# Patient Record
Sex: Male | Born: 1940 | Race: White | Hispanic: No | Marital: Married | Smoking: Former smoker
Health system: Southern US, Community
[De-identification: ages and names within clinical notes are randomized; demographics above are authoritative.]

## PROBLEM LIST (undated history)

## (undated) DIAGNOSIS — I10 Essential (primary) hypertension: Secondary | ICD-10-CM

## (undated) DIAGNOSIS — K08109 Complete loss of teeth, unspecified cause, unspecified class: Secondary | ICD-10-CM

## (undated) DIAGNOSIS — K429 Umbilical hernia without obstruction or gangrene: Secondary | ICD-10-CM

## (undated) DIAGNOSIS — K219 Gastro-esophageal reflux disease without esophagitis: Secondary | ICD-10-CM

## (undated) DIAGNOSIS — N529 Male erectile dysfunction, unspecified: Secondary | ICD-10-CM

## (undated) DIAGNOSIS — D472 Monoclonal gammopathy: Secondary | ICD-10-CM

## (undated) DIAGNOSIS — I119 Hypertensive heart disease without heart failure: Secondary | ICD-10-CM

## (undated) DIAGNOSIS — D649 Anemia, unspecified: Secondary | ICD-10-CM

## (undated) DIAGNOSIS — Z889 Allergy status to unspecified drugs, medicaments and biological substances status: Secondary | ICD-10-CM

## (undated) DIAGNOSIS — Z972 Presence of dental prosthetic device (complete) (partial): Secondary | ICD-10-CM

## (undated) DIAGNOSIS — Z974 Presence of external hearing-aid: Secondary | ICD-10-CM

## (undated) HISTORY — PX: COLONOSCOPY: SHX174

## (undated) HISTORY — PX: HERNIA REPAIR: SHX51

## (undated) HISTORY — DX: Anemia, unspecified: D64.9

## (undated) HISTORY — PX: EYE SURGERY: SHX253

## (undated) HISTORY — DX: Monoclonal gammopathy: D47.2

---

## 2005-10-13 ENCOUNTER — Ambulatory Visit: Payer: Self-pay | Admitting: Unknown Physician Specialty

## 2005-10-20 ENCOUNTER — Ambulatory Visit: Payer: Self-pay | Admitting: Unknown Physician Specialty

## 2008-01-27 ENCOUNTER — Ambulatory Visit: Payer: Self-pay | Admitting: Unknown Physician Specialty

## 2009-05-20 ENCOUNTER — Ambulatory Visit: Payer: Self-pay | Admitting: Internal Medicine

## 2009-07-28 ENCOUNTER — Ambulatory Visit: Payer: Self-pay | Admitting: Gastroenterology

## 2009-09-12 ENCOUNTER — Ambulatory Visit: Payer: Self-pay | Admitting: Gastroenterology

## 2010-11-07 ENCOUNTER — Ambulatory Visit: Payer: Self-pay | Admitting: Surgery

## 2013-03-24 ENCOUNTER — Ambulatory Visit: Payer: Self-pay | Admitting: Unknown Physician Specialty

## 2013-03-28 LAB — PATHOLOGY REPORT

## 2013-10-24 ENCOUNTER — Ambulatory Visit: Payer: Self-pay | Admitting: Oncology

## 2013-11-22 ENCOUNTER — Ambulatory Visit: Payer: Self-pay | Admitting: Oncology

## 2014-01-31 ENCOUNTER — Ambulatory Visit: Payer: Self-pay | Admitting: Oncology

## 2014-01-31 LAB — BASIC METABOLIC PANEL
Anion Gap: 6 — ABNORMAL LOW (ref 7–16)
BUN: 22 mg/dL — AB (ref 7–18)
CALCIUM: 8.2 mg/dL — AB (ref 8.5–10.1)
Chloride: 108 mmol/L — ABNORMAL HIGH (ref 98–107)
Co2: 26 mmol/L (ref 21–32)
Creatinine: 0.97 mg/dL (ref 0.60–1.30)
EGFR (Non-African Amer.): >60
GLUCOSE: 120 mg/dL — AB (ref 65–99)
Osmolality: 284 (ref 275–301)
POTASSIUM: 3.9 mmol/L (ref 3.5–5.1)
Sodium: 140 mmol/L (ref 136–145)

## 2014-01-31 LAB — CBC CANCER CENTER
BASOS ABS: 0 x10 3/mm (ref 0.0–0.1)
BASOS PCT: 0.8 %
EOS PCT: 5.2 %
Eosinophil #: 0.3 x10 3/mm (ref 0.0–0.7)
HCT: 37.9 % — ABNORMAL LOW (ref 40.0–52.0)
HGB: 12.7 g/dL — ABNORMAL LOW (ref 13.0–18.0)
LYMPHS ABS: 2.6 x10 3/mm (ref 1.0–3.6)
LYMPHS PCT: 49.6 %
MCH: 31.7 pg (ref 26.0–34.0)
MCHC: 33.4 g/dL (ref 32.0–36.0)
MCV: 95 fL (ref 80–100)
MONO ABS: 0.4 x10 3/mm (ref 0.2–1.0)
Monocyte %: 8.1 %
NEUTROS PCT: 36.3 %
Neutrophil #: 1.9 x10 3/mm (ref 1.4–6.5)
Platelet: 183 x10 3/mm (ref 150–440)
RBC: 4 10*6/uL — AB (ref 4.40–5.90)
RDW: 14.7 % — ABNORMAL HIGH (ref 11.5–14.5)
WBC: 5.3 x10 3/mm (ref 3.8–10.6)

## 2014-01-31 LAB — IRON AND TIBC
IRON BIND. CAP.(TOTAL): 369 ug/dL (ref 250–450)
Iron Saturation: 28 %
Iron: 103 ug/dL (ref 65–175)
Unbound Iron-Bind.Cap.: 266 ug/dL

## 2014-01-31 LAB — FERRITIN: Ferritin (ARMC): 257 ng/mL (ref 8–388)

## 2014-02-05 LAB — KAPPA/LAMBDA FREE LIGHT CHAINS (ARMC)

## 2014-02-05 LAB — URINE IEP, RANDOM

## 2014-02-05 LAB — PROT IMMUNOELECTROPHORES(ARMC)

## 2014-02-22 ENCOUNTER — Ambulatory Visit: Payer: Self-pay | Admitting: Oncology

## 2014-05-03 ENCOUNTER — Ambulatory Visit: Payer: Self-pay | Admitting: Oncology

## 2014-05-03 LAB — BASIC METABOLIC PANEL
ANION GAP: 7 (ref 7–16)
BUN: 18 mg/dL (ref 7–18)
Calcium, Total: 8.3 mg/dL — ABNORMAL LOW (ref 8.5–10.1)
Chloride: 110 mmol/L — ABNORMAL HIGH (ref 98–107)
Co2: 25 mmol/L (ref 21–32)
Creatinine: 0.73 mg/dL (ref 0.60–1.30)
EGFR (African American): >60
EGFR (Non-African Amer.): >60
Glucose: 134 mg/dL — ABNORMAL HIGH (ref 65–99)
OSMOLALITY: 287 (ref 275–301)
Potassium: 3.8 mmol/L (ref 3.5–5.1)
SODIUM: 142 mmol/L (ref 136–145)

## 2014-05-03 LAB — CBC CANCER CENTER
Basophil #: 0 x10 3/mm (ref 0.0–0.1)
Basophil %: 0.7 %
Eosinophil #: 0.5 x10 3/mm (ref 0.0–0.7)
Eosinophil %: 8.7 %
HCT: 37.6 % — ABNORMAL LOW (ref 40.0–52.0)
HGB: 12.4 g/dL — ABNORMAL LOW (ref 13.0–18.0)
LYMPHS PCT: 51.5 %
Lymphocyte #: 3 x10 3/mm (ref 1.0–3.6)
MCH: 31.6 pg (ref 26.0–34.0)
MCHC: 33 g/dL (ref 32.0–36.0)
MCV: 96 fL (ref 80–100)
MONO ABS: 0.4 x10 3/mm (ref 0.2–1.0)
Monocyte %: 6.9 %
NEUTROS PCT: 32.2 %
Neutrophil #: 1.9 x10 3/mm (ref 1.4–6.5)
Platelet: 183 x10 3/mm (ref 150–440)
RBC: 3.93 10*6/uL — ABNORMAL LOW (ref 4.40–5.90)
RDW: 15.1 % — AB (ref 11.5–14.5)
WBC: 5.8 x10 3/mm (ref 3.8–10.6)

## 2014-05-04 LAB — PROT IMMUNOELECTROPHORES(ARMC)

## 2014-05-04 LAB — URINE IEP, RANDOM

## 2014-05-04 LAB — KAPPA/LAMBDA FREE LIGHT CHAINS (ARMC)

## 2014-05-25 ENCOUNTER — Ambulatory Visit: Payer: Self-pay | Admitting: Oncology

## 2014-07-17 ENCOUNTER — Ambulatory Visit: Payer: Self-pay | Admitting: Surgery

## 2014-07-24 ENCOUNTER — Ambulatory Visit: Payer: Self-pay | Admitting: Surgery

## 2014-09-23 NOTE — Op Note (Signed)
PATIENT NAME:  Austin Evans, Austin Evans MR#:  [id number] DATE OF BIRTH:  1941/04/03  DATE OF PROCEDURE:  07/24/2014  PREOPERATIVE DIAGNOSIS: Ventral hernia.   POSTOPERATIVE DIAGNOSIS: Ventral hernia.   PROCEDURE: Ventral hernia repair.   SURGEON: Loreli Dollar, MD   ANESTHESIA: General.   INDICATIONS: This 74 year old male recently has been having bulging in the epigastrium. A ventral hernia was demonstrated on physical exam and repair was recommended for definitive treatment.   DESCRIPTION OF PROCEDURE: The patient was placed on the operating table in the supine position under general anesthesia. The abdomen was prepared with ChloraPrep, draped in a sterile manner. A longitudinally oriented 4 cm incision was made in the epigastrium so that the lower end of the incision extended down as far as a supraumbilical, transversely oriented scar. The dissection was carried down through subcutaneous tissues. Several small bleeding points were cauterized. A ventral hernia sac was encountered and was dissected free from surrounding structures. It appeared that the hernia contained fatty tissue and could not be reduced. It was necessary to extend the incision in the fascia on the left side extending about 6 mm, which allowed for reduction of the hernia. The properitoneal fat was dissected away from the fascial ring defect circumferentially and then cut out a Bard soft mesh to make an oval shape some 1.6 x 2 cm. This was placed into the properitoneal plane and sutured to the overlying fascia with through-and-through 0 Surgilon sutures. Next, the repair was further carried out with a row of 0 Surgilon figure-of-eight sutures oriented with a transversely oriented suture line incorporating each suture into the mesh. The repair looked good. Hemostasis was intact. The deep fascia and subcutaneous tissues were infiltrated with 0.5% Sensorcaine with epinephrine. Subcutaneous tissues were closed with 4-0 chromic, and the skin was  closed with running 4-0 Monocryl subcuticular suture and LiquiBand. The patient tolerated surgery satisfactorily and was prepared for transfer to the recovery room.    ____________________________ Lenna Sciara. Rochel Brome, MD jws:ST D: 07/27/2014 17:39:31 ET T: 07/27/2014 23:52:28 ET JOB#: [id number]  cc: Loreli Dollar, MD, <Dictator> Loreli Dollar MD ELECTRONICALLY SIGNED 08/02/2014 13:33

## 2014-09-23 NOTE — H&P (Signed)
PATIENT NAME:  Austin Evans, Austin Evans MR#:  [id number] DATE OF BIRTH:  03-03-1941  DATE OF ADMISSION:  07/24/2014  PREOPERATIVE DIAGNOSIS: Ventral hernia.   POSTOPERATIVE DIAGNOSIS: Ventral hernia.   PROCEDURE: Ventral hernia repair.   SURGEON: Rochel Brome, MD  ANESTHESIA: General.   INDICATION: This 74 year old male has had recent development of bulging in the epigastrium and a ventral hernia was demonstrated on physical exam and repair was recommended for definitive treatment.   DESCRIPTION OF PROCEDURE: The patient was placed on the operating table in the supine position under general anesthesia. The abdomen was prepared with ChloraPrep and draped in a sterile manner.   A midline incision was made in the epigastrium approximately 4 cm in length with the lower end at the site of an old supraumbilical incision. The dissection was carried down through a thin layer of subcutaneous tissue to encounter a ventral hernia sac which there was a bulge which was approximately 2.8 cm. This appeared to contain fat. The sac was dissected free from surrounding structures and dissected away from the fascial ring defect. The fatty tissue appeared to be incarcerated and the incision was made at the left lateral portion of the fascial ring defect to enlarge the fascial ring defect enough to reduce the hernia. The sac was dissected away from the surrounding fascial ring defect and also properitoneal fat was pushed back away from the fascia circumferentially. It did appear that there was diastasis recti and somewhat thin fascia. Next, a Bard soft mesh was cut to create an oval shape of some 2.5 x 3 cm and was placed into the properitoneal plane, oriented transversely, and was sutured to the overlying fascia with 0 Surgilon through and through sutures. Next, the fascia was closed with a transversely oriented suture line of interrupted 0 Surgilon figure-of-eight sutures incorporating the mesh into each suture. The repair looked  good. It is noted that during the course of the procedure a number of small bleeding points were cauterized. Hemostasis was subsequently intact and blood loss very minimal. The fascia and subcutaneous tissues were infiltrated with 0.5% Sensorcaine with epinephrine. The subcutaneous tissues were approximated in the midline with interrupted inverted 4-0 Monocryl. The skin was closed with running 4-0 Monocryl subcuticular suture and LiquiBand.   The patient tolerated the procedure satisfactorily and was then prepared for transfer to the recovery room.  ____________________________ Lenna Sciara. Rochel Brome, MD jws:sb D: 07/24/2014 08:35:30 ET T: 07/24/2014 08:51:26 ET JOB#: [id number]  cc: Loreli Dollar, MD, <Dictator> Loreli Dollar MD ELECTRONICALLY SIGNED 07/27/2014 10:06

## 2014-10-08 ENCOUNTER — Emergency Department: Payer: Medicare Other | Admitting: Anesthesiology

## 2014-10-08 ENCOUNTER — Emergency Department: Payer: Medicare Other

## 2014-10-08 ENCOUNTER — Ambulatory Visit
Admission: EM | Admit: 2014-10-08 | Discharge: 2014-10-08 | Disposition: A | Discharge status: Home / Self Care | Payer: Medicare Other | Attending: Gastroenterology | Admitting: Gastroenterology

## 2014-10-08 ENCOUNTER — Encounter: Admission: EM | Disposition: A | Discharge status: Home / Self Care | Payer: Self-pay | Source: Home / Self Care

## 2014-10-08 ENCOUNTER — Encounter: Payer: Self-pay | Admitting: Emergency Medicine

## 2014-10-08 DIAGNOSIS — X58XXXA Exposure to other specified factors, initial encounter: Secondary | ICD-10-CM | POA: Insufficient documentation

## 2014-10-08 DIAGNOSIS — T18128A Food in esophagus causing other injury, initial encounter: Secondary | ICD-10-CM | POA: Diagnosis not present

## 2014-10-08 DIAGNOSIS — I10 Essential (primary) hypertension: Secondary | ICD-10-CM | POA: Diagnosis not present

## 2014-10-08 DIAGNOSIS — K219 Gastro-esophageal reflux disease without esophagitis: Secondary | ICD-10-CM | POA: Diagnosis not present

## 2014-10-08 DIAGNOSIS — H9190 Unspecified hearing loss, unspecified ear: Secondary | ICD-10-CM | POA: Insufficient documentation

## 2014-10-08 DIAGNOSIS — Z87891 Personal history of nicotine dependence: Secondary | ICD-10-CM | POA: Insufficient documentation

## 2014-10-08 DIAGNOSIS — Z79899 Other long term (current) drug therapy: Secondary | ICD-10-CM | POA: Insufficient documentation

## 2014-10-08 DIAGNOSIS — T18108A Unspecified foreign body in esophagus causing other injury, initial encounter: Secondary | ICD-10-CM | POA: Diagnosis present

## 2014-10-08 HISTORY — DX: Essential (primary) hypertension: I10

## 2014-10-08 HISTORY — PX: FOREIGN BODY REMOVAL: SHX962

## 2014-10-08 HISTORY — DX: Gastro-esophageal reflux disease without esophagitis: K21.9

## 2014-10-08 HISTORY — DX: Presence of external hearing-aid: Z97.4

## 2014-10-08 LAB — CBC
HCT: 39.4 % — ABNORMAL LOW (ref 40.0–52.0)
Hemoglobin: 13.3 g/dL (ref 13.0–18.0)
MCH: 30.8 pg (ref 26.0–34.0)
MCHC: 33.7 g/dL (ref 32.0–36.0)
MCV: 91.5 fL (ref 80.0–100.0)
PLATELETS: 177 10*3/uL (ref 150–440)
RBC: 4.31 MIL/uL — ABNORMAL LOW (ref 4.40–5.90)
RDW: 15.1 % — ABNORMAL HIGH (ref 11.5–14.5)
WBC: 6.3 10*3/uL (ref 3.8–10.6)

## 2014-10-08 LAB — COMPREHENSIVE METABOLIC PANEL
ALT: 34 U/L (ref 17–63)
ANION GAP: 10 (ref 5–15)
AST: 28 U/L (ref 15–41)
Albumin: 3.9 g/dL (ref 3.5–5.0)
Alkaline Phosphatase: 43 U/L (ref 38–126)
BUN: 20 mg/dL (ref 6–20)
CALCIUM: 9.1 mg/dL (ref 8.9–10.3)
CO2: 25 mmol/L (ref 22–32)
Chloride: 107 mmol/L (ref 101–111)
Creatinine, Ser: 0.78 mg/dL (ref 0.61–1.24)
GFR calc non Af Amer: >60 mL/min (ref >60)
GLUCOSE: 92 mg/dL (ref 65–99)
Potassium: 3.9 mmol/L (ref 3.5–5.1)
SODIUM: 142 mmol/L (ref 135–145)
TOTAL PROTEIN: 8.6 g/dL — AB (ref 6.5–8.1)
Total Bilirubin: 0.7 mg/dL (ref 0.3–1.2)

## 2014-10-08 SURGERY — REMOVAL, FOREIGN BODY
Anesthesia: General | Wound class: Clean Contaminated

## 2014-10-08 MED ORDER — PROPOFOL 10 MG/ML IV BOLUS
INTRAVENOUS | Status: DC | PRN
  Administered 2014-10-08: 150 mg via INTRAVENOUS

## 2014-10-08 MED ORDER — LACTATED RINGERS IV SOLN
INTRAVENOUS | Status: DC | PRN
  Administered 2014-10-08: 20:00:00 via INTRAVENOUS

## 2014-10-08 MED ORDER — GLUCAGON HCL RDNA (DIAGNOSTIC) 1 MG IJ SOLR
INTRAMUSCULAR | Status: AC
  Filled 2014-10-08: qty 1

## 2014-10-08 MED ORDER — FENTANYL CITRATE (PF) 100 MCG/2ML IJ SOLN
INTRAMUSCULAR | Status: DC | PRN
  Administered 2014-10-08: 50 ug via INTRAVENOUS

## 2014-10-08 MED ORDER — ONDANSETRON HCL 4 MG/2ML IJ SOLN: 4.0000 mg | Freq: Once | INTRAMUSCULAR | Status: AC | PRN

## 2014-10-08 MED ORDER — SUCCINYLCHOLINE CHLORIDE 20 MG/ML IJ SOLN
INTRAMUSCULAR | Status: DC | PRN
  Administered 2014-10-08: 80 mg via INTRAVENOUS

## 2014-10-08 MED ORDER — FENTANYL CITRATE (PF) 100 MCG/2ML IJ SOLN
25.0000 ug | INTRAMUSCULAR | Status: DC | PRN
Start: 2014-10-08 — End: 2014-10-09

## 2014-10-08 MED ORDER — MIDAZOLAM HCL 2 MG/2ML IJ SOLN
INTRAMUSCULAR | Status: DC | PRN
Start: 2014-10-08 — End: 2014-10-08
  Administered 2014-10-08: 1 mg via INTRAVENOUS

## 2014-10-08 MED ORDER — GLUCAGON HCL RDNA (DIAGNOSTIC) 1 MG IJ SOLR
1.0000 mg | Freq: Once | INTRAMUSCULAR | Status: AC
  Administered 2014-10-08: 1 mg via INTRAVENOUS

## 2014-10-08 MED ORDER — LIDOCAINE HCL (CARDIAC) 20 MG/ML IV SOLN
INTRAVENOUS | Status: DC | PRN
  Administered 2014-10-08: 50 mg via INTRAVENOUS

## 2014-10-08 SURGICAL SUPPLY — 1 items: FG600U ×3 IMPLANT

## 2014-10-08 NOTE — ED Notes (Signed)
Pt states he was eating steak and got a piece stuck.  Feels like now it is in midsternal area.  No resp distress , states that he cant swallow water or it will come back up

## 2014-10-08 NOTE — ED Provider Notes (Signed)
-----------------------------------------   7:18 PM on 10/08/2014 -----------------------------------------  Assuming care from nurse practitioner Victorino Dike, FNP In short, Austin Evans is a 74 y.o. male with a chief complaint of esophageal Foreign Body .  Refer to the original H&P for additional details.  The current plan of care is to have the patient go up to surgery under the care of Dr. Allen Norris patient is stable with no complaints at this time he was taken up to the surgical floor  Meshulem Onorato Verdene Rio, PA-C 10/08/14 1920  Delman Kitten, MD 10/20/14 [phone]

## 2014-10-08 NOTE — ED Notes (Signed)
No stridor present. No drooling.

## 2014-10-08 NOTE — Anesthesia Postprocedure Evaluation (Signed)
  Anesthesia Post-op Note  Patient: Austin Evans  Procedure(s) Performed: Procedure(s): upper endoscopy with FOREIGN BODY REMOVAL (N/A)  Anesthesia type:General  Patient location: PACU  Post pain: Pain level controlled  Post assessment: Post-op Vital signs reviewed, Patient's Cardiovascular Status Stable, Respiratory Function Stable, Patent Airway and No signs of Nausea or vomiting  Post vital signs: Reviewed and stable  Last Vitals:  Filed Vitals:   10/08/14 2007  BP: 159/96  Pulse: 67  Temp: 36.1 C  Resp: 24    Level of consciousness: awake, alert  and patient cooperative  Complications: No apparent anesthesia complications

## 2014-10-08 NOTE — Transfer of Care (Signed)
Immediate Anesthesia Transfer of Care Note  Patient: Austin Evans  Procedure(s) Performed: Procedure(s): upper endoscopy with FOREIGN BODY REMOVAL (N/A)  Patient Location: PACU  Anesthesia Type:General  Level of Consciousness: awake, alert , oriented and patient cooperative  Airway & Oxygen Therapy: Patient Spontanous Breathing and Patient connected to face mask oxygen  Post-op Assessment: Report given to RN, Post -op Vital signs reviewed and stable and Patient moving all extremities  Post vital signs: Reviewed and stable  Last Vitals:  Filed Vitals:   10/08/14 1923  BP: 185/93  Pulse: 70  Temp: 36.9 C  Resp: 18    Complications: No apparent anesthesia complications

## 2014-10-08 NOTE — ED Notes (Signed)
Patient transported to OR by hospital orderly. NAD noted. Accompanied by patient spouse.

## 2014-10-08 NOTE — Anesthesia Preprocedure Evaluation (Addendum)
Anesthesia Evaluation  Patient identified by MRN, date of birth, ID band Patient awake    Reviewed: Allergy & Precautions, NPO status , Patient's Chart, lab work & pertinent test results  History of Anesthesia Complications Negative for: history of anesthetic complications  Airway Mallampati: II  TM Distance: >3 FB Neck ROM: Full    Dental no notable dental hx.    Pulmonary neg pulmonary ROS, former smoker,  breath sounds clear to auscultation  Pulmonary exam normal       Cardiovascular hypertension, Pt. on medications Normal cardiovascular examRhythm:Regular Rate:Normal     Neuro/Psych negative neurological ROS  negative psych ROS   GI/Hepatic Neg liver ROS, GERD-  Medicated,  Endo/Other  negative endocrine ROS  Renal/GU negative Renal ROS  negative genitourinary   Musculoskeletal negative musculoskeletal ROS (+)   Abdominal   Peds negative pediatric ROS (+)  Hematology negative hematology ROS (+)   Anesthesia Other Findings   Reproductive/Obstetrics negative OB ROS                            Anesthesia Physical Anesthesia Plan  ASA: III and emergent  Anesthesia Plan: General   Post-op Pain Management:    Induction: Rapid sequence, Cricoid pressure planned and Intravenous  Airway Management Planned: Oral ETT  Additional Equipment:   Intra-op Plan:   Post-operative Plan: Extubation in OR  Informed Consent: I have reviewed the patients History and Physical, chart, labs and discussed the procedure including the risks, benefits and alternatives for the proposed anesthesia with the patient or authorized representative who has indicated his/her understanding and acceptance.   Dental advisory given  Plan Discussed with: CRNA and Surgeon  Anesthesia Plan Comments:         Anesthesia Quick Evaluation

## 2014-10-08 NOTE — ED Provider Notes (Signed)
[facility] Emergency Department Provider Note  ____________________________________________  Time seen: Approximately 4:57 PM  I have reviewed the triage vital signs and the nursing notes.   HISTORY  Chief Complaint Foreign Body    HPI CHESLEY VEASEY is a 74 y.o. male who presents for an esophageal foreign body. He reports that he was eating steak last night around 6:30 and feels as if it is stuck. He is unable to drink. He states that after 3 sips of any fluid he spits it back up. He reports long-standing history of the same with 2 previous endoscopies to remove the food bolus.   Past Medical History  Diagnosis Date  . Hypertension   . GERD (gastroesophageal reflux disease)   . Hearing aid worn     There are no active problems to display for this patient.   Past Surgical History  Procedure Laterality Date  . Hernia repair      No current outpatient prescriptions on file.  Allergies Review of patient's allergies indicates no known allergies.  History reviewed. No pertinent family history.  Social History History  Substance Use Topics  . Smoking status: Former Research scientist (life sciences)  . Smokeless tobacco: Not on file  . Alcohol Use: Yes    Review of Systems Constitutional: No fever/chills Eyes: No visual changes. ENT: No sore throat. Cardiovascular: Denies chest pain. Respiratory: Denies shortness of breath. Gastrointestinal: No abdominal pain.  No nausea, no vomiting.  No diarrhea.  No constipation. Genitourinary: Negative for dysuria. Musculoskeletal: Negative for pain. Skin: Negative for rash. Neurological: Negative for headaches, focal weakness or numbness.  10-point ROS otherwise negative.  ____________________________________________   PHYSICAL EXAM:  VITAL SIGNS: ED Triage Vitals  Enc Vitals Group     BP 10/08/14 1429 172/103 mmHg     Pulse --      Resp 10/08/14 1429 16     Temp 10/08/14 1429 98.3 F (36.8 C)     Temp Source  10/08/14 1429 Oral     SpO2 10/08/14 1429 99 %     Weight 10/08/14 1433 193 lb 5.5 oz (87.7 kg)     Height 10/08/14 1429 5\' 8"  (1.727 m)     Head Cir --      Peak Flow --      Pain Score 10/08/14 1427 0     Pain Loc --      Pain Edu? --      Excl. in [place]? --     Constitutional: Alert and oriented. Well appearing and in no acute distress. Eyes: Conjunctivae are normal. PERRL. EOMI. Head: Atraumatic. Nose: No congestion/rhinnorhea. Mouth/Throat: Mucous membranes are moist.  Oropharynx non-erythematous or edema. Tolerating secretions. No drooling. Neck: No stridor.  Cardiovascular: Normal rate, regular rhythm. Grossly normal heart sounds.  Good peripheral circulation. Respiratory: Normal respiratory effort.  No retractions. Lungs CTAB. Gastrointestinal: Soft and nontender. No distention. No abdominal bruits. No CVA tenderness. Musculoskeletal: Normal range of motion in all extremities Neurologic:  Normal speech and language. No gross focal neurologic deficits are appreciated. Speech is normal. No gait instability. Skin:  Skin is warm, dry and intact. No rash noted. Psychiatric: Mood and affect are normal. Speech and behavior are normal.  ____________________________________________   LABS (all labs ordered are listed, but only abnormal results are displayed)  Labs Reviewed  CBC - Abnormal; Notable for the following:    RBC 4.31 (*)    HCT 39.4 (*)    RDW 15.1 (*)    All other components  within normal limits  COMPREHENSIVE METABOLIC PANEL   ____________________________________________  EKG   ____________________________________________  RADIOLOGY  Soft tissue neck negative for foreign body. ____________________________________________   PROCEDURES  Procedure(s) performed: None  Critical Care performed: No  ____________________________________________   INITIAL IMPRESSION / ASSESSMENT AND PLAN / ED COURSE  Pertinent labs & imaging results that were available  during my care of the patient were reviewed by me and considered in my medical decision making (see chart for details).  IV glucagon ordered and given. Will reassess in 30 minutes.   ----------------------------------------- 6:07 PM on 10/08/2014 ----------------------------------------- NAD. Will attempt po challenge.  ----------------------------------------- 6:26 PM on 10/08/2014 ----------------------------------------- Unable to tolerate fluids. Dr. Allen Norris consulted and will come in to assess Mr. Plemmons.  ----------------------------------------- 7:09 PM on 10/08/2014 -----------------------------------------  Awaiting assessment and plan from Dr. Allen Norris. Patient in room in NAD at this time. Care transferred to W. Ruffian, PA-C. ____________________________________________   FINAL CLINICAL IMPRESSION(S) / ED DIAGNOSES  Final diagnoses:  None      Victorino Dike, FNP 10/08/14 [place], MD 10/08/14 2211

## 2014-10-08 NOTE — ED Notes (Signed)
Pt was eating steak last night and has had steak stuck in esophagus; PCP told him to come here.  Denies dyspnea.

## 2014-10-08 NOTE — Anesthesia Procedure Notes (Signed)
Procedure Name: Intubation Date/Time: 10/08/2014 7:43 PM Performed by: Lendon Colonel Pre-anesthesia Checklist: Patient identified, Suction available, Emergency Drugs available and Patient being monitored Patient Re-evaluated:Patient Re-evaluated prior to inductionOxygen Delivery Method: Circle system utilized Preoxygenation: Pre-oxygenation with 100% oxygen Intubation Type: IV induction, Rapid sequence and Cricoid Pressure applied Laryngoscope Size: Miller and 2 Grade View: Grade I Tube type: Oral Tube size: 7.5 mm Number of attempts: 1 Airway Equipment and Method: Stylet Placement Confirmation: ETT inserted through vocal cords under direct vision,  positive ETCO2 and breath sounds checked- equal and bilateral Secured at: 20 cm Tube secured with: Tape Dental Injury: Teeth and Oropharynx as per pre-operative assessment

## 2014-10-08 NOTE — H&P (Signed)
  [facility]  [address] [place], [place] [address] Phone: [phone] Fax : [phone]  Primary Care Physician:  No primary care provider on file. Primary Gastroenterologist:  Dr. Allen Norris  Pre-Procedure History & Physical: HPI:  Austin Evans is a 74 y.o. male is here for an endoscopy.   Past Medical History  Diagnosis Date  . Hypertension   . GERD (gastroesophageal reflux disease)   . Hearing aid worn     Past Surgical History  Procedure Laterality Date  . Hernia repair      Prior to Admission medications   Medication Sig Start Date End Date Taking? Authorizing Provider  benazepril (LOTENSIN) 20 MG tablet Take 20 mg by mouth daily.   Yes Historical Provider, MD  pantoprazole (PROTONIX) 20 MG tablet Take 20 mg by mouth daily.   Yes Historical Provider, MD    Allergies as of 10/08/2014  . (No Known Allergies)    History reviewed. No pertinent family history.  History   Social History  . Marital Status: Married    Spouse Name: N/A  . Number of Children: N/A  . Years of Education: N/A   Occupational History  . Not on file.   Social History Main Topics  . Smoking status: Former Research scientist (life sciences)  . Smokeless tobacco: Not on file  . Alcohol Use: Yes  . Drug Use: No  . Sexual Activity: Not on file   Other Topics Concern  . Not on file   Social History Narrative  . No narrative on file    Review of Systems: See HPI, otherwise negative ROS  Physical Exam: BP 185/93 mmHg  Pulse 70  Temp(Src) 98.4 F (36.9 C) (Oral)  Resp 18  Ht 5\' 8"  (1.727 m)  Wt 193 lb 5.5 oz (87.7 kg)  BMI 29.40 kg/m2  SpO2 99% General:   Alert,  pleasant and cooperative in NAD Head:  Normocephalic and atraumatic. Neck:  Supple; no masses or thyromegaly. Lungs:  Clear throughout to auscultation.    Heart:  Regular rate and rhythm. Abdomen:  Soft, nontender and nondistended. Normal bowel sounds, without guarding, and without rebound.   Neurologic:  Alert and   oriented x4;  grossly normal neurologically.  Impression/Plan: Austin Evans is here for an endoscopy to be performed for Food bolu  Risks, benefits, limitations, and alternatives regarding  endoscopy have been reviewed with the patient.  Questions have been answered.  All parties agreeable.   [facility], MD  10/08/2014, 7:36 PM

## 2014-10-09 ENCOUNTER — Encounter: Payer: Self-pay | Admitting: Gastroenterology

## 2014-10-09 NOTE — Op Note (Signed)
[facility] Gastroenterology Patient Name: Austin Evans Procedure Date: 10/08/2014 7:13 PM MRN: [id number] Account #: [id number] Date of Birth: 1941-05-17 Admit Type: Outpatient Age: 74 Room: [facility] ENDO ROOM 4 Gender: Male Note Status: Finalized Procedure:         Upper GI endoscopy Indications:       Foreign body in the esophagus Providers:         Lucilla Lame, MD Medicines:         General Anesthesia Complications:     No immediate complications. Procedure:         Pre-Anesthesia Assessment:                    - Prior to the procedure, a History and Physical was                     performed, and patient medications and allergies were                     reviewed. The patient's tolerance of previous anesthesia                     was also reviewed. The risks and benefits of the procedure                     and the sedation options and risks were discussed with the                     patient. All questions were answered, and informed consent                     was obtained. Prior Anticoagulants: The patient has taken                     no previous anticoagulant or antiplatelet agents. ASA                     Grade Assessment: II - A patient with mild systemic                     disease. After reviewing the risks and benefits, the                     patient was deemed in satisfactory condition to undergo                     the procedure.                    After obtaining informed consent, the endoscope was passed                     under direct vision. Throughout the procedure, the                     patient's blood pressure, pulse, and oxygen saturations                     were monitored continuously. The Olympus GIF-160 endoscope                     (S#. [id number]) was introduced through the mouth, and                     advanced to the second part of duodenum.  The upper GI                     endoscopy was accomplished without difficulty. The patient                   tolerated the procedure well. Findings:      Food was found at the gastroesophageal junction. Removal was       accomplished with a Tripod.      The stomach was normal.      The examined duodenum was normal. Impression:        - Food was found in the esophagus. Removal was successful.                    - Normal stomach.                    - Normal examined duodenum. Recommendation:    - Repeat the upper endoscopy in 3 weeks for retreatment. Procedure Code(s): --- Professional ---                    [id number], Esophagogastroduodenoscopy, flexible, transoral;                     with removal of foreign body(s) Diagnosis Code(s): --- Professional ---                    L85.909P, Food in esophagus causing other injury, initial                     encounter                    T18.108A, Unspecified foreign body in esophagus causing                     other injury, initial encounter CPT copyright 2014 American Medical Association. All rights reserved. The codes documented in this report are preliminary and upon coder review may  be revised to meet current compliance requirements. Lucilla Lame, MD 10/08/2014 7:52:24 PM This report has been signed electronically. Number of Addenda: 0 Note Initiated On: 10/08/2014 7:13 PM      [facility]

## 2014-11-21 ENCOUNTER — Encounter: Payer: Self-pay | Admitting: *Deleted

## 2014-11-28 NOTE — Discharge Instructions (Signed)

## 2014-11-30 ENCOUNTER — Ambulatory Visit: Payer: Medicare Other | Admitting: Anesthesiology

## 2014-11-30 ENCOUNTER — Encounter: Payer: Self-pay | Admitting: *Deleted

## 2014-11-30 ENCOUNTER — Encounter
Admission: RE | Disposition: A | Discharge status: Home / Self Care | Payer: Self-pay | Source: Ambulatory Visit | Attending: Gastroenterology

## 2014-11-30 ENCOUNTER — Other Ambulatory Visit: Payer: Self-pay | Admitting: Gastroenterology

## 2014-11-30 ENCOUNTER — Ambulatory Visit
Admission: RE | Admit: 2014-11-30 | Discharge: 2014-11-30 | Disposition: A | Discharge status: Home / Self Care | Payer: Medicare Other | Source: Ambulatory Visit | Attending: Gastroenterology | Admitting: Gastroenterology

## 2014-11-30 DIAGNOSIS — Z79899 Other long term (current) drug therapy: Secondary | ICD-10-CM | POA: Insufficient documentation

## 2014-11-30 DIAGNOSIS — K319 Disease of stomach and duodenum, unspecified: Secondary | ICD-10-CM | POA: Diagnosis not present

## 2014-11-30 DIAGNOSIS — K2950 Unspecified chronic gastritis without bleeding: Secondary | ICD-10-CM | POA: Insufficient documentation

## 2014-11-30 DIAGNOSIS — Z7951 Long term (current) use of inhaled steroids: Secondary | ICD-10-CM | POA: Diagnosis not present

## 2014-11-30 DIAGNOSIS — I10 Essential (primary) hypertension: Secondary | ICD-10-CM | POA: Insufficient documentation

## 2014-11-30 DIAGNOSIS — K219 Gastro-esophageal reflux disease without esophagitis: Secondary | ICD-10-CM | POA: Insufficient documentation

## 2014-11-30 DIAGNOSIS — Z87891 Personal history of nicotine dependence: Secondary | ICD-10-CM | POA: Diagnosis not present

## 2014-11-30 DIAGNOSIS — K222 Esophageal obstruction: Secondary | ICD-10-CM | POA: Insufficient documentation

## 2014-11-30 DIAGNOSIS — R1310 Dysphagia, unspecified: Secondary | ICD-10-CM | POA: Insufficient documentation

## 2014-11-30 DIAGNOSIS — K2970 Gastritis, unspecified, without bleeding: Secondary | ICD-10-CM | POA: Diagnosis not present

## 2014-11-30 HISTORY — DX: Complete loss of teeth, unspecified cause, unspecified class: K08.109

## 2014-11-30 HISTORY — PX: ESOPHAGOGASTRODUODENOSCOPY: SHX5428

## 2014-11-30 HISTORY — DX: Presence of dental prosthetic device (complete) (partial): Z97.2

## 2014-11-30 SURGERY — EGD (ESOPHAGOGASTRODUODENOSCOPY)
Anesthesia: Monitor Anesthesia Care | Wound class: Clean Contaminated

## 2014-11-30 MED ORDER — GLYCOPYRROLATE 0.2 MG/ML IJ SOLN
INTRAMUSCULAR | Status: DC | PRN
  Administered 2014-11-30: 0.2 mg via INTRAVENOUS

## 2014-11-30 MED ORDER — LIDOCAINE HCL (CARDIAC) 20 MG/ML IV SOLN
INTRAVENOUS | Status: DC | PRN
  Administered 2014-11-30: 50 mg via INTRAVENOUS

## 2014-11-30 MED ORDER — PROPOFOL 10 MG/ML IV BOLUS
INTRAVENOUS | Status: DC | PRN
  Administered 2014-11-30: 20 mg via INTRAVENOUS
  Administered 2014-11-30: 50 mg via INTRAVENOUS
  Administered 2014-11-30: 40 mg via INTRAVENOUS
  Administered 2014-11-30: 20 mg via INTRAVENOUS

## 2014-11-30 MED ORDER — ACETAMINOPHEN 325 MG PO TABS
325.0000 mg | ORAL_TABLET | ORAL | Status: DC | PRN
Start: 2014-11-30 — End: 2014-11-30

## 2014-11-30 MED ORDER — LACTATED RINGERS IV SOLN: INTRAVENOUS | Status: DC

## 2014-11-30 MED ORDER — ACETAMINOPHEN 160 MG/5ML PO SOLN: 325.0000 mg | ORAL | Status: DC | PRN

## 2014-11-30 MED ORDER — LACTATED RINGERS IV SOLN
INTRAVENOUS | Status: DC
  Administered 2014-11-30: 09:00:00 via INTRAVENOUS

## 2014-11-30 SURGICAL SUPPLY — 39 items
BALLN DILATOR 10-12 8 (BALLOONS)
BALLN DILATOR 12-15 8 (BALLOONS) ×2
BALLN DILATOR 15-18 8 (BALLOONS)
BALLN DILATOR CRE 0-12 8 (BALLOONS)
BALLN DILATOR ESOPH 8 10 CRE (MISCELLANEOUS) IMPLANT
BALLOON DILATOR 12-15 8 (BALLOONS) ×1 IMPLANT
BALLOON DILATOR 15-18 8 (BALLOONS) IMPLANT
BALLOON DILATOR CRE 0-12 8 (BALLOONS) IMPLANT
BLOCK BITE 60FR ADLT L/F GRN (MISCELLANEOUS) ×2 IMPLANT
CANISTER SUCT 1200ML W/VALVE (MISCELLANEOUS) ×2 IMPLANT
FCP ESCP3.2XJMB 240X2.8X (MISCELLANEOUS)
FORCEPS BIOP RAD 4 LRG CAP 4 (CUTTING FORCEPS) IMPLANT
FORCEPS BIOP RJ4 240 W/NDL (MISCELLANEOUS)
FORCEPS ESCP3.2XJMB 240X2.8X (MISCELLANEOUS) IMPLANT
GOWN CVR UNV OPN BCK APRN NK (MISCELLANEOUS) ×2 IMPLANT
GOWN ISOL THUMB LOOP REG UNIV (MISCELLANEOUS) ×2
HEMOCLIP INSTINCT (CLIP) IMPLANT
INJECTOR VARIJECT VIN23 (MISCELLANEOUS) IMPLANT
KIT CO2 TUBING (TUBING) IMPLANT
KIT DEFENDO VALVE AND CONN (KITS) IMPLANT
KIT ENDO PROCEDURE OLY (KITS) ×2 IMPLANT
LIGATOR MULTIBAND 6SHOOTER MBL (MISCELLANEOUS) IMPLANT
MARKER SPOT ENDO TATTOO 5ML (MISCELLANEOUS) IMPLANT
PAD GROUND ADULT SPLIT (MISCELLANEOUS) IMPLANT
SNARE SHORT THROW 13M SML OVAL (MISCELLANEOUS) IMPLANT
SNARE SHORT THROW 30M LRG OVAL (MISCELLANEOUS) IMPLANT
SPOT EX ENDOSCOPIC TATTOO (MISCELLANEOUS)
SUCTION POLY TRAP 4CHAMBER (MISCELLANEOUS) IMPLANT
SYR INFLATION 60ML (SYRINGE) ×2 IMPLANT
TRAP SUCTION POLY (MISCELLANEOUS) IMPLANT
TUBING CONN 6MMX3.1M (TUBING)
TUBING SUCTION CONN 0.25 STRL (TUBING) IMPLANT
UNDERPAD 30X60 958B10 (PK) (MISCELLANEOUS) IMPLANT
VALVE BIOPSY ENDO (VALVE) IMPLANT
VARIJECT INJECTOR VIN23 (MISCELLANEOUS)
WATER AUXILLARY (MISCELLANEOUS) IMPLANT
WATER STERILE IRR 250ML POUR (IV SOLUTION) ×2 IMPLANT
WATER STERILE IRR 500ML POUR (IV SOLUTION) IMPLANT
WIRE CRE 18-20MM 8CM F G (MISCELLANEOUS) IMPLANT

## 2014-11-30 NOTE — H&P (Signed)
  [facility]  [address], [place], [place] [address] Phone: [phone] Fax : [phone]  Primary Care Physician:  Adrian Prows, MD Primary Gastroenterologist:  Dr. Allen Norris  Pre-Procedure History & Physical: HPI:  Austin Evans is a 74 y.o. male is here for an endoscopy.   Past Medical History  Diagnosis Date  . Hypertension   . GERD (gastroesophageal reflux disease)   . Hearing aid worn   . Full dentures     upper and lower    Past Surgical History  Procedure Laterality Date  . Hernia repair    . Foreign body removal N/A 10/08/2014    Procedure: upper endoscopy with FOREIGN BODY REMOVAL;  Surgeon: Lucilla Lame, MD;  Location: [facility] ENDOSCOPY;  Service: Endoscopy;  Laterality: N/A;  . Eye surgery Bilateral     cataracts    Prior to Admission medications   Medication Sig Start Date End Date Taking? Authorizing Provider  benazepril (LOTENSIN) 20 MG tablet Take 20 mg by mouth daily. AM   Yes Historical Provider, MD  fexofenadine-pseudoephedrine (ALLEGRA-D 24) 180-240 MG per 24 hr tablet Take 1 tablet by mouth daily. AM   Yes Historical Provider, MD  fluticasone (FLONASE) 50 MCG/ACT nasal spray Place 1 spray into both nostrils daily. AM   Yes Historical Provider, MD  pantoprazole (PROTONIX) 20 MG tablet Take 20 mg by mouth daily. AM   Yes Historical Provider, MD    Allergies as of 11/05/2014  . (No Known Allergies)    History reviewed. No pertinent family history.  History   Social History  . Marital Status: Married    Spouse Name: N/A  . Number of Children: N/A  . Years of Education: N/A   Occupational History  . Not on file.   Social History Main Topics  . Smoking status: Former Smoker    Quit date: 05/26/2003  . Smokeless tobacco: Not on file  . Alcohol Use: 6.0 oz/week    10 Shots of liquor per week  . Drug Use: No  . Sexual Activity: Not on file   Other Topics Concern  . Not on file   Social History Narrative    Review of  Systems: See HPI, otherwise negative ROS  Physical Exam: BP 163/91 mmHg  Pulse 64  Temp(Src) 97.7 F (36.5 C)  Resp 18  Ht 5\' 8"  (1.727 m)  Wt 193 lb (87.544 kg)  BMI 29.35 kg/m2  SpO2 98% General:   Alert,  pleasant and cooperative in NAD Head:  Normocephalic and atraumatic. Neck:  Supple; no masses or thyromegaly. Lungs:  Clear throughout to auscultation.    Heart:  Regular rate and rhythm. Abdomen:  Soft, nontender and nondistended. Normal bowel sounds, without guarding, and without rebound.   Neurologic:  Alert and  oriented x4;  grossly normal neurologically.  Impression/Plan: RITCHIE KLEE is here for an endoscopy to be performed for Dysphagia   Risks, benefits, limitations, and alternatives regarding  endoscopy have been reviewed with the patient.  Questions have been answered.  All parties agreeable.   Ollen Bowl, MD  11/30/2014, 8:57 AM

## 2014-11-30 NOTE — Op Note (Signed)
[facility]astro[facility] Patient Name: Austin Evans Procedure Date: 11/30/2014 8:54 AM MRN: [id number] Account #: [id number] Date of Birth: 10/10/1940 Admit Type: Outpatient Age: 74 Room: [facility] OR ROOM 01 Gender: Male Note Status: Finalized Procedure:         Upper GI endoscopy Indications:       Dysphagia Providers:         Lucilla Lame, MD Referring MD:      Youlanda Roys. Ola Spurr, MD (Referring MD) Medicines:         Propofol per Anesthesia Complications:     No immediate complications. Procedure:         Pre-Anesthesia Assessment:                    - Prior to the procedure, a History and Physical was                     performed, and patient medications and allergies were                     reviewed. The patient's tolerance of previous anesthesia                     was also reviewed. The risks and benefits of the procedure                     and the sedation options and risks were discussed with the                     patient. All questions were answered, and informed consent                     was obtained. Prior Anticoagulants: The patient has taken                     no previous anticoagulant or antiplatelet agents. ASA                     Grade Assessment: II - A patient with mild systemic                     disease. After reviewing the risks and benefits, the                     patient was deemed in satisfactory condition to undergo                     the procedure.                    After obtaining informed consent, the endoscope was passed                     under direct vision. Throughout the procedure, the                     patient's blood pressure, pulse, and oxygen saturations                     were monitored continuously. The Olympus GIF-HQ190                     Endoscope (S#. [id number]) was introduced through the mouth,  and advanced to the second part of duodenum. The upper GI                     endoscopy was  accomplished without difficulty. The patient                     tolerated the procedure well. Findings:      A benign-appearing, intrinsic mild stenosis was found at the       gastroesophageal junction and was traversed. A TTS dilator was passed       through the scope. Dilation with a 15-16.5-18 mm balloon (to a maximum       balloon size of 18 mm) dilator was performed. The dilation site was       examined and showed complete resolution of luminal narrowing.      Localized moderate inflammation characterized by erythema was found in       the gastric antrum. Biopsies were taken with a cold forceps for       histology.      Localized nodular mucosa was found in the ampulla. Biopsies were taken       with a cold forceps for histology. Impression:        - Benign-appearing esophageal stricture. Dilated.                    - Gastritis. Biopsied.                    - Nodular mucosa in the ampulla. Biopsied. Recommendation:    - Await pathology results. Procedure Code(s): --- Professional ---                    [phone], Esophagogastroduodenoscopy, flexible, transoral;                     with transendoscopic balloon dilation of esophagus (less                     than 30 mm diameter)                    43239, Esophagogastroduodenoscopy, flexible, transoral;                     with biopsy, single or multiple Diagnosis Code(s): --- Professional ---                    R13.10, Dysphagia, unspecified                    K22.2, Esophageal obstruction                    K29.70, Gastritis, unspecified, without bleeding                    K31.9, Disease of stomach and duodenum, unspecified CPT copyright 2014 American Medical Association. All rights reserved. The codes documented in this report are preliminary and upon coder review may  be revised to meet current compliance requirements. Lucilla Lame, MD 11/30/2014 9:17:11 AM This report has been signed electronically. Number of Addenda: 0 Note  Initiated On: 11/30/2014 8:54 AM Total Procedure Duration: 0 hours 7 minutes 8 seconds       [facility]

## 2014-11-30 NOTE — Anesthesia Procedure Notes (Signed)
Procedure Name: MAC Performed by: Leen Tworek Pre-anesthesia Checklist: Patient identified, Emergency Drugs available, Suction available, Timeout performed and Patient being monitored Patient Re-evaluated:Patient Re-evaluated prior to inductionOxygen Delivery Method: Nasal cannula Placement Confirmation: positive ETCO2       

## 2014-11-30 NOTE — Anesthesia Postprocedure Evaluation (Signed)
  Anesthesia Post-op Note  Patient: Austin Evans  Procedure(s) Performed: Procedure(s): ESOPHAGOGASTRODUODENOSCOPY (EGD) with dialtion (N/A)  Anesthesia type:MAC  Patient location: PACU  Post pain: Pain level controlled  Post assessment: Post-op Vital signs reviewed, Patient's Cardiovascular Status Stable, Respiratory Function Stable, Patent Airway and No signs of Nausea or vomiting  Post vital signs: Reviewed and stable  Last Vitals:  Filed Vitals:   11/30/14 0920  BP: 124/84  Pulse: 73  Temp: 36.3 C  Resp: 11    Level of consciousness: awake, alert  and patient cooperative  Complications: No apparent anesthesia complications

## 2014-11-30 NOTE — Transfer of Care (Signed)
Immediate Anesthesia Transfer of Care Note  Patient: Austin Evans  Procedure(s) Performed: Procedure(s): ESOPHAGOGASTRODUODENOSCOPY (EGD) with dialtion (N/A)  Patient Location: PACU  Anesthesia Type: MAC  Level of Consciousness: awake, alert  and patient cooperative  Airway and Oxygen Therapy: Patient Spontanous Breathing and Patient connected to supplemental oxygen  Post-op Assessment: Post-op Vital signs reviewed, Patient's Cardiovascular Status Stable, Respiratory Function Stable, Patent Airway and No signs of Nausea or vomiting  Post-op Vital Signs: Reviewed and stable  Complications: No apparent anesthesia complications

## 2014-11-30 NOTE — Anesthesia Preprocedure Evaluation (Addendum)
Anesthesia Evaluation   Patient awake    Reviewed: Allergy & Precautions, NPO status   Airway Mallampati: I  TM Distance: >3 FB Neck ROM: full    Dental  (+) Upper Dentures, Lower Dentures   Pulmonary former smoker,    Pulmonary exam normal       Cardiovascular hypertension, Normal cardiovascular exam    Neuro/Psych negative neurological ROS  negative psych ROS   GI/Hepatic Neg liver ROS, GERD-  ,  Endo/Other  negative endocrine ROS  Renal/GU negative Renal ROS  negative genitourinary   Musculoskeletal   Abdominal   Peds  Hematology negative hematology ROS (+)   Anesthesia Other Findings   Reproductive/Obstetrics                           Anesthesia Physical Anesthesia Plan  ASA: II  Anesthesia Plan: MAC   Post-op Pain Management:    Induction: Intravenous  Airway Management Planned: Nasal Cannula  Additional Equipment:   Intra-op Plan:   Post-operative Plan:   Informed Consent: I have reviewed the patients History and Physical, chart, labs and discussed the procedure including the risks, benefits and alternatives for the proposed anesthesia with the patient or authorized representative who has indicated his/her understanding and acceptance.     Plan Discussed with:   Anesthesia Plan Comments:         Anesthesia Quick Evaluation

## 2014-12-18 ENCOUNTER — Encounter: Payer: Self-pay | Admitting: Gastroenterology

## 2015-05-03 ENCOUNTER — Other Ambulatory Visit: Payer: Self-pay | Admitting: Oncology

## 2015-05-03 DIAGNOSIS — D472 Monoclonal gammopathy: Secondary | ICD-10-CM

## 2015-05-06 ENCOUNTER — Inpatient Hospital Stay: Payer: Medicare Other | Attending: Oncology

## 2015-05-06 DIAGNOSIS — K219 Gastro-esophageal reflux disease without esophagitis: Secondary | ICD-10-CM | POA: Diagnosis not present

## 2015-05-06 DIAGNOSIS — I10 Essential (primary) hypertension: Secondary | ICD-10-CM | POA: Insufficient documentation

## 2015-05-06 DIAGNOSIS — D472 Monoclonal gammopathy: Secondary | ICD-10-CM | POA: Insufficient documentation

## 2015-05-06 DIAGNOSIS — Z87891 Personal history of nicotine dependence: Secondary | ICD-10-CM | POA: Diagnosis not present

## 2015-05-06 DIAGNOSIS — Z79899 Other long term (current) drug therapy: Secondary | ICD-10-CM | POA: Diagnosis not present

## 2015-05-06 LAB — BASIC METABOLIC PANEL
Anion gap: 5 (ref 5–15)
BUN: 19 mg/dL (ref 6–20)
CHLORIDE: 106 mmol/L (ref 101–111)
CO2: 27 mmol/L (ref 22–32)
Calcium: 8.3 mg/dL — ABNORMAL LOW (ref 8.9–10.3)
Creatinine, Ser: 0.77 mg/dL (ref 0.61–1.24)
GFR calc non Af Amer: >60 mL/min (ref >60)
Glucose, Bld: 103 mg/dL — ABNORMAL HIGH (ref 65–99)
POTASSIUM: 3.6 mmol/L (ref 3.5–5.1)
Sodium: 138 mmol/L (ref 135–145)

## 2015-05-06 LAB — CBC WITH DIFFERENTIAL/PLATELET
Basophils Absolute: 0 10*3/uL (ref 0–0.1)
Basophils Relative: 1 %
EOS ABS: 0.2 10*3/uL (ref 0–0.7)
Eosinophils Relative: 3 %
HCT: 36.3 % — ABNORMAL LOW (ref 40.0–52.0)
Hemoglobin: 12 g/dL — ABNORMAL LOW (ref 13.0–18.0)
LYMPHS ABS: 2.6 10*3/uL (ref 1.0–3.6)
LYMPHS PCT: 48 %
MCH: 30.2 pg (ref 26.0–34.0)
MCHC: 33.2 g/dL (ref 32.0–36.0)
MCV: 91 fL (ref 80.0–100.0)
Monocytes Absolute: 0.4 10*3/uL (ref 0.2–1.0)
Monocytes Relative: 7 %
Neutro Abs: 2.2 10*3/uL (ref 1.4–6.5)
Neutrophils Relative %: 41 %
Platelets: 188 10*3/uL (ref 150–440)
RBC: 3.99 MIL/uL — AB (ref 4.40–5.90)
RDW: 15.9 % — ABNORMAL HIGH (ref 11.5–14.5)
WBC: 5.4 10*3/uL (ref 3.8–10.6)

## 2015-05-07 LAB — PROTEIN ELECTROPHORESIS, SERUM
A/G Ratio: 0.9 (ref 0.7–1.7)
ALBUMIN ELP: 3.6 g/dL (ref 2.9–4.4)
Alpha-1-Globulin: 0.2 g/dL (ref 0.0–0.4)
Alpha-2-Globulin: 0.7 g/dL (ref 0.4–1.0)
BETA GLOBULIN: 2.9 g/dL — AB (ref 0.7–1.3)
GAMMA GLOBULIN: 0.2 g/dL — AB (ref 0.4–1.8)
Globulin, Total: 4.1 g/dL — ABNORMAL HIGH (ref 2.2–3.9)
M-Spike, %: 2 g/dL — ABNORMAL HIGH
Total Protein ELP: 7.7 g/dL (ref 6.0–8.5)

## 2015-05-08 LAB — PROTEIN ELECTRO, RANDOM URINE
ALBUMIN ELP UR: 22.4 %
ALPHA-2-GLOBULIN, U: 8.1 %
Alpha-1-Globulin, U: 1.5 %
Beta Globulin, U: 63.8 %
Gamma Globulin, U: 4.3 %
M Component, Ur: 56.2 % — ABNORMAL HIGH
TOTAL PROTEIN, URINE-UPE24: 73.1 mg/dL

## 2015-05-13 ENCOUNTER — Inpatient Hospital Stay (HOSPITAL_BASED_OUTPATIENT_CLINIC_OR_DEPARTMENT_OTHER): Payer: Medicare Other | Admitting: Oncology

## 2015-05-13 ENCOUNTER — Encounter: Payer: Self-pay | Admitting: Oncology

## 2015-05-13 DIAGNOSIS — Z79899 Other long term (current) drug therapy: Secondary | ICD-10-CM | POA: Diagnosis not present

## 2015-05-13 DIAGNOSIS — D472 Monoclonal gammopathy: Secondary | ICD-10-CM

## 2015-05-13 NOTE — Progress Notes (Signed)
Patient is currently being treated for pneumonia.

## 2015-05-25 NOTE — Progress Notes (Signed)
Austin Evans  Telephone:(336) [phone] Fax:(336) [phone]  ID: Austin Evans OB: 11/11/1940  MR#: [id number]  CHY#:[id number]  Patient Care Team: Adrian Prows, MD as PCP - General (Infectious Diseases)  CHIEF COMPLAINT:  Chief Complaint  Patient presents with  . MGUS    INTERVAL HISTORY: Patient returns to clinic today for routine yearly evaluation and repeat laboratory work. He continues to feel well and is asymptomatic. He does not complain of weakness and fatigue.  He denies any neurologic complaints. He has a good appetite and denies weight loss. He denies any bony pain. He has no chest pain or shortness of breath. He denies any nausea, vomiting, constipation, or diarrhea. He has no melena or hematochezia. He has no urinary complaints. Patient offers no specific complaints today.  REVIEW OF SYSTEMS:   Review of Systems  Constitutional: Negative.  Negative for fever, weight loss and malaise/fatigue.  Respiratory: Negative.  Negative for shortness of breath.   Cardiovascular: Negative.  Negative for chest pain.  Gastrointestinal: Negative.   Musculoskeletal: Negative.   Neurological: Negative.  Negative for weakness.    As per HPI. Otherwise, a complete review of systems is negatve.  PAST MEDICAL HISTORY: Past Medical History  Diagnosis Date  . Hypertension   . GERD (gastroesophageal reflux disease)   . Hearing aid worn   . Full dentures     upper and lower  . MGUS (monoclonal gammopathy of unknown significance)   . Anemia     PAST SURGICAL HISTORY: Past Surgical History  Procedure Laterality Date  . Hernia repair    . Foreign body removal N/A 10/08/2014    Procedure: upper endoscopy with FOREIGN BODY REMOVAL;  Surgeon: Lucilla Lame, MD;  Location: [facility] ENDOSCOPY;  Service: Endoscopy;  Laterality: N/A;  . Eye surgery Bilateral     cataracts  . Esophagogastroduodenoscopy N/A 11/30/2014    Procedure: ESOPHAGOGASTRODUODENOSCOPY (EGD) with dialtion;   Surgeon: Lucilla Lame, MD;  Location: [place];  Service: Gastroenterology;  Laterality: N/A;    FAMILY HISTORY: Mother with history of dementia.     ADVANCED DIRECTIVES:    HEALTH MAINTENANCE: Social History  Substance Use Topics  . Smoking status: Former Smoker    Quit date: 05/26/2003  . Smokeless tobacco: Not on file  . Alcohol Use: 6.0 oz/week    10 Shots of liquor per week     Colonoscopy:  PAP:  Bone density:  Lipid panel:  No Known Allergies  Current Outpatient Prescriptions  Medication Sig Dispense Refill  . benazepril (LOTENSIN) 20 MG tablet Take 20 mg by mouth daily. AM    . fexofenadine-pseudoephedrine (ALLEGRA-D 24) 180-240 MG per 24 hr tablet Take 1 tablet by mouth daily. AM    . fluticasone (FLONASE) 50 MCG/ACT nasal spray Place 1 spray into both nostrils daily. AM    . minocycline (MINOCIN,DYNACIN) 100 MG capsule take 1 capsule by mouth twice a day for 10 days  0  . pantoprazole (PROTONIX) 20 MG tablet Take 20 mg by mouth daily. AM     No current facility-administered medications for this visit.    OBJECTIVE: There were no vitals filed for this visit.   There is no weight on file to calculate BMI.    ECOG FS:0 - Asymptomatic  General: Well-developed, well-nourished, no acute distress. Eyes: Pink conjunctiva, anicteric sclera. Lungs: Clear to auscultation bilaterally. Heart: Regular rate and rhythm. No rubs, murmurs, or gallops. Abdomen: Soft, nontender, nondistended. No organomegaly noted, normoactive bowel sounds. Musculoskeletal: No edema,  cyanosis, or clubbing. Neuro: Alert, answering all questions appropriately. Cranial nerves grossly intact. Skin: No rashes or petechiae noted. Psych: Normal affect.   LAB RESULTS:  Lab Results  Component Value Date   NA 138 05/06/2015   K 3.6 05/06/2015   CL 106 05/06/2015   CO2 27 05/06/2015   GLUCOSE 103* 05/06/2015   BUN 19 05/06/2015   CREATININE 0.77 05/06/2015   CALCIUM 8.3* 05/06/2015     PROT 8.6* 10/08/2014   ALBUMIN 3.9 10/08/2014   AST 28 10/08/2014   ALT 34 10/08/2014   ALKPHOS 43 10/08/2014   BILITOT 0.7 10/08/2014   GFRNONAA >60 05/06/2015   GFRAA >60 05/06/2015    Lab Results  Component Value Date   WBC 5.4 05/06/2015   NEUTROABS 2.2 05/06/2015   HGB 12.0* 05/06/2015   HCT 36.3* 05/06/2015   MCV 91.0 05/06/2015   PLT 188 05/06/2015     STUDIES: No results found.  ASSESSMENT: MGUS.  PLAN:    1. 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 His serum M spike is stable at 2.0. He has no evidence of endorgan damage. Metastatic bone survey with no worrisome lesions. This does not need to be repeated unless there is suspicion of progression of disease. After lengthy discussion, patient wishes less frequent follow-up despite acknowledging the risk of possible conversion to a more aggressive form of myeloma. He will return to clinic in 1 year for repeat laboratory work and further evaluation.  Patient expressed understanding and was in agreement with this plan. He also understands that He can call clinic at any time with any questions, concerns, or complaints.    Lloyd Huger, MD   05/25/2015 2:36 PM

## 2016-05-11 ENCOUNTER — Other Ambulatory Visit: Payer: Self-pay | Admitting: *Deleted

## 2016-05-11 DIAGNOSIS — D472 Monoclonal gammopathy: Secondary | ICD-10-CM

## 2016-05-12 ENCOUNTER — Inpatient Hospital Stay: Payer: Medicare Other | Attending: Oncology

## 2016-05-12 DIAGNOSIS — D472 Monoclonal gammopathy: Secondary | ICD-10-CM | POA: Insufficient documentation

## 2016-05-12 DIAGNOSIS — K219 Gastro-esophageal reflux disease without esophagitis: Secondary | ICD-10-CM | POA: Insufficient documentation

## 2016-05-12 DIAGNOSIS — Z79899 Other long term (current) drug therapy: Secondary | ICD-10-CM | POA: Diagnosis not present

## 2016-05-12 DIAGNOSIS — I10 Essential (primary) hypertension: Secondary | ICD-10-CM | POA: Diagnosis not present

## 2016-05-12 DIAGNOSIS — Z87891 Personal history of nicotine dependence: Secondary | ICD-10-CM | POA: Diagnosis not present

## 2016-05-12 LAB — BASIC METABOLIC PANEL
Anion gap: 5 (ref 5–15)
BUN: 22 mg/dL — ABNORMAL HIGH (ref 6–20)
CHLORIDE: 105 mmol/L (ref 101–111)
CO2: 27 mmol/L (ref 22–32)
CREATININE: 0.81 mg/dL (ref 0.61–1.24)
Calcium: 9 mg/dL (ref 8.9–10.3)
Glucose, Bld: 123 mg/dL — ABNORMAL HIGH (ref 65–99)
POTASSIUM: 4 mmol/L (ref 3.5–5.1)
SODIUM: 137 mmol/L (ref 135–145)

## 2016-05-12 LAB — CBC
HCT: 35.9 % — ABNORMAL LOW (ref 40.0–52.0)
HEMOGLOBIN: 12.2 g/dL — AB (ref 13.0–18.0)
MCH: 30.8 pg (ref 26.0–34.0)
MCHC: 34.1 g/dL (ref 32.0–36.0)
MCV: 90.2 fL (ref 80.0–100.0)
Platelets: 188 10*3/uL (ref 150–440)
RBC: 3.97 MIL/uL — AB (ref 4.40–5.90)
RDW: 15.6 % — ABNORMAL HIGH (ref 11.5–14.5)
WBC: 5 10*3/uL (ref 3.8–10.6)

## 2016-05-13 LAB — KAPPA/LAMBDA LIGHT CHAINS
Kappa free light chain: 318.4 mg/L — ABNORMAL HIGH (ref 3.3–19.4)
Kappa, lambda light chain ratio: 46.14 — ABNORMAL HIGH (ref 0.26–1.65)
Lambda free light chains: 6.9 mg/L (ref 5.7–26.3)

## 2016-05-14 LAB — PROTEIN ELECTROPHORESIS, SERUM
A/G Ratio: 0.9 (ref 0.7–1.7)
ALPHA-1-GLOBULIN: 0.2 g/dL (ref 0.0–0.4)
Albumin ELP: 3.6 g/dL (ref 2.9–4.4)
Alpha-2-Globulin: 0.7 g/dL (ref 0.4–1.0)
BETA GLOBULIN: 3 g/dL — AB (ref 0.7–1.3)
GAMMA GLOBULIN: 0.2 g/dL — AB (ref 0.4–1.8)
Globulin, Total: 4.1 g/dL — ABNORMAL HIGH (ref 2.2–3.9)
M-SPIKE, %: 1.8 g/dL — AB
TOTAL PROTEIN ELP: 7.7 g/dL (ref 6.0–8.5)

## 2016-05-14 LAB — PROTEIN ELECTRO, RANDOM URINE
ALPHA-2-GLOBULIN, U: 7.4 %
Albumin ELP, Urine: 20.3 %
Alpha-1-Globulin, U: 1.4 %
BETA GLOBULIN, U: 67.2 %
Gamma Globulin, U: 3.7 %
M SPIKE UR: 53.8 % — AB
PDF-U24IEL: 0
TOTAL PROTEIN, URINE-UPE24: 45.4 mg/dL

## 2016-05-19 DIAGNOSIS — D472 Monoclonal gammopathy: Secondary | ICD-10-CM | POA: Insufficient documentation

## 2016-05-19 NOTE — Progress Notes (Signed)
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  INTERVAL HISTORY: Patient returns to clinic today for routine yearly evaluation and repeat laboratory work. He continues to feel well and is asymptomatic. He does not complain of weakness or fatigue.  He denies any neurologic complaints. He has a good appetite and denies weight loss. He denies any bony pain. He has no chest pain or shortness of breath. He denies any nausea, vomiting, constipation, or diarrhea. He has no melena or hematochezia. He has no urinary complaints. Patient offers no specific complaints today. Patient states he will start "semi-retirement" next year, reducing from 5 to 3 days/week.  REVIEW OF SYSTEMS:   Review of Systems  Constitutional: Negative.  Negative for fever, malaise/fatigue and weight loss.  Respiratory: Negative.  Negative for cough and shortness of breath.   Cardiovascular: Negative.  Negative for chest pain and leg swelling.  Gastrointestinal: Negative.  Negative for abdominal pain.  Musculoskeletal: Negative.  Negative for back pain and joint pain.  Neurological: Negative.  Negative for weakness.  Psychiatric/Behavioral: Negative.  The patient is not nervous/anxious.     As per HPI. Otherwise, a complete review of systems is negative.  PAST MEDICAL HISTORY: Past Medical History:  Diagnosis Date  . Anemia   . Full dentures    upper and lower  . GERD (gastroesophageal reflux disease)   . Hearing aid worn   . Hypertension   . MGUS (monoclonal gammopathy of unknown significance)     PAST SURGICAL HISTORY: Past Surgical History:  Procedure Laterality Date  . ESOPHAGOGASTRODUODENOSCOPY N/A 11/30/2014   Procedure: ESOPHAGOGASTRODUODENOSCOPY (EGD) with dialtion;  Surgeon:  Lucilla Lame, MD;  Location: [place];  Service: Gastroenterology;  Laterality: N/A;  . EYE SURGERY Bilateral    cataracts  . FOREIGN BODY REMOVAL N/A 10/08/2014   Procedure: upper endoscopy with FOREIGN BODY REMOVAL;  Surgeon: Lucilla Lame, MD;  Location: [facility] ENDOSCOPY;  Service: Endoscopy;  Laterality: N/A;  . HERNIA REPAIR      FAMILY HISTORY: Mother with history of dementia.     ADVANCED DIRECTIVES:    HEALTH MAINTENANCE: Social History  Substance Use Topics  . Smoking status: Former Smoker    Quit date: 05/26/2003  . Smokeless tobacco: Not on file  . Alcohol use 6.0 oz/week    10 Shots of liquor per week     Colonoscopy:  PAP:  Bone density:  Lipid panel:  No Known Allergies  Current Outpatient Prescriptions  Medication Sig Dispense Refill  . amLODipine (NORVASC) 5 MG tablet Take 5 mg by mouth daily.    . benazepril (LOTENSIN) 40 MG tablet Take 40 mg by mouth daily.    . fluticasone (FLONASE) 50 MCG/ACT nasal spray Place 2 sprays into both nostrils daily. AM     . hydrochlorothiazide (HYDRODIURIL) 25 MG tablet Take 25 mg by mouth daily.    Marland Kitchen levocetirizine (XYZAL) 5 MG tablet Take 5 mg by mouth every evening.    . meloxicam (MOBIC) 15 MG tablet Take 15 mg by mouth daily.    . pantoprazole (PROTONIX) 40 MG tablet Take 40 mg by mouth daily.     No current facility-administered medications for this visit.     OBJECTIVE: Vitals:   05/20/16 1434  BP: (!) 175/87  Pulse: 71  Resp: 18  Temp: 97.1 F (36.2 C)     Body mass index is 31.22 kg/m.    ECOG FS:0 - Asymptomatic  General: Well-developed, well-nourished, no acute distress. Eyes: Pink conjunctiva, anicteric sclera. Lungs: Clear to auscultation bilaterally. Heart: Regular rate and rhythm. No rubs, murmurs, or gallops. Abdomen: Soft, nontender, nondistended. No organomegaly noted, normoactive bowel sounds. Musculoskeletal: No edema, cyanosis, or clubbing. Neuro: Alert, answering all questions  appropriately. Cranial nerves grossly intact. Skin: No rashes or petechiae noted. Psych: Normal affect.   LAB RESULTS:  Lab Results  Component Value Date   NA 137 05/12/2016   K 4.0 05/12/2016   CL 105 05/12/2016   CO2 27 05/12/2016   GLUCOSE 123 (H) 05/12/2016   BUN 22 (H) 05/12/2016   CREATININE 0.81 05/12/2016   CALCIUM 9.0 05/12/2016   PROT 8.6 (H) 10/08/2014   ALBUMIN 3.9 10/08/2014   AST 28 10/08/2014   ALT 34 10/08/2014   ALKPHOS 43 10/08/2014   BILITOT 0.7 10/08/2014   GFRNONAA >60 05/12/2016   GFRAA >60 05/12/2016    Lab Results  Component Value Date   WBC 5.0 05/12/2016   NEUTROABS 2.2 05/06/2015   HGB 12.2 (L) 05/12/2016   HCT 35.9 (L) 05/12/2016   MCV 90.2 05/12/2016   PLT 188 05/12/2016   Lab Results  Component Value Date   TOTALPROTELP 7.7 05/12/2016   ALBUMINELP 3.6 05/12/2016   A1GS 0.2 05/12/2016   A2GS 0.7 05/12/2016   BETS 3.0 (H) 05/12/2016   GAMS 0.2 (L) 05/12/2016   MSPIKE 1.8 (H) 05/12/2016   SPEI Comment 05/12/2016      STUDIES: No results found.  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  PLAN:    1. 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 His serum M spike is stable at 1.8. He has no evidence of endorgan damage. Metastatic bone survey in 2015 with no worrisome lesions. This does not need to be repeated unless there is suspicion of progression of disease. After lengthy discussion, patient wishes to remain on annual follow-up despite acknowledging the risk of possible conversion to a more aggressive form of myeloma. He will return to clinic in 1 year for repeat laboratory work and further evaluation.  2. Fluid intake: discussed plan to ramp up water intake.  Patient expressed understanding and was in agreement with this plan. He also understands that He can call clinic at any time with any questions, concerns, or complaints.    Lucendia Herrlich, NP   05/20/2016 3:38 PM   Patient was seen and evaluated  independently and I agree with the assessment and plan as dictated above. Bone marrow biopsy was also discussed with patient which she continues to decline. He has agreed to undergo biopsy if there is clinical suspicion of progression.  Lloyd Huger, MD 05/22/16 12:32 PM

## 2016-05-20 ENCOUNTER — Inpatient Hospital Stay (HOSPITAL_BASED_OUTPATIENT_CLINIC_OR_DEPARTMENT_OTHER): Payer: Medicare Other | Admitting: Oncology

## 2016-05-20 VITALS — BP 175/87 | HR 71 | Temp 97.1°F | Resp 18 | Wt 205.4 lb

## 2016-05-20 DIAGNOSIS — Z87891 Personal history of nicotine dependence: Secondary | ICD-10-CM | POA: Diagnosis not present

## 2016-05-20 DIAGNOSIS — D472 Monoclonal gammopathy: Secondary | ICD-10-CM | POA: Diagnosis not present

## 2016-05-20 DIAGNOSIS — Z79899 Other long term (current) drug therapy: Secondary | ICD-10-CM | POA: Diagnosis not present

## 2016-05-20 NOTE — Progress Notes (Signed)
Offers no complaints. Feeling well. 

## 2016-05-21 ENCOUNTER — Other Ambulatory Visit: Payer: Self-pay | Admitting: *Deleted

## 2017-05-13 ENCOUNTER — Other Ambulatory Visit: Payer: Medicare Other

## 2017-05-18 NOTE — Progress Notes (Deleted)
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  INTERVAL HISTORY: Patient returns to clinic today for routine yearly evaluation and repeat laboratory work. He continues to feel well and is asymptomatic. He does not complain of weakness or fatigue.  He denies any neurologic complaints. He has a good appetite and denies weight loss. He denies any bony pain. He has no chest pain or shortness of breath. He denies any nausea, vomiting, constipation, or diarrhea. He has no melena or hematochezia. He has no urinary complaints. Patient offers no specific complaints today. Patient states he will start "semi-retirement" next year, reducing from 5 to 3 days/week.  REVIEW OF SYSTEMS:   Review of Systems  Constitutional: Negative.  Negative for fever, malaise/fatigue and weight loss.  Respiratory: Negative.  Negative for cough and shortness of breath.   Cardiovascular: Negative.  Negative for chest pain and leg swelling.  Gastrointestinal: Negative.  Negative for abdominal pain.  Musculoskeletal: Negative.  Negative for back pain and joint pain.  Neurological: Negative.  Negative for weakness.  Psychiatric/Behavioral: Negative.  The patient is not nervous/anxious.     As per HPI. Otherwise, a complete review of systems is negative.  PAST MEDICAL HISTORY: Past Medical History:  Diagnosis Date  . Anemia   . Full dentures    upper and lower  . GERD (gastroesophageal reflux disease)   . Hearing aid worn   . Hypertension   . MGUS (monoclonal gammopathy of unknown significance)     PAST SURGICAL HISTORY: Past Surgical History:  Procedure Laterality Date  . ESOPHAGOGASTRODUODENOSCOPY N/A 11/30/2014   Procedure: ESOPHAGOGASTRODUODENOSCOPY (EGD) with dialtion;  Surgeon:  Lucilla Lame, MD;  Location: [place];  Service: Gastroenterology;  Laterality: N/A;  . EYE SURGERY Bilateral    cataracts  . FOREIGN BODY REMOVAL N/A 10/08/2014   Procedure: upper endoscopy with FOREIGN BODY REMOVAL;  Surgeon: Lucilla Lame, MD;  Location: [facility];  Service: Endoscopy;  Laterality: N/A;  . HERNIA REPAIR      FAMILY HISTORY: Mother with history of dementia.     ADVANCED DIRECTIVES:    HEALTH MAINTENANCE: Social History   Tobacco Use  . Smoking status: Former Smoker    Last attempt to quit: 05/26/2003    Years since quitting: 13.9  Substance Use Topics  . Alcohol use: Yes    Alcohol/week: 6.0 oz    Types: 10 Shots of liquor per week  . Drug use: No     Colonoscopy:  PAP:  Bone density:  Lipid panel:  No Known Allergies  Current Outpatient Medications  Medication Sig Dispense Refill  . amLODipine (NORVASC) 5 MG tablet Take 5 mg by mouth daily.    . benazepril (LOTENSIN) 40 MG tablet Take 40 mg by mouth daily.    . fluticasone (FLONASE) 50 MCG/ACT nasal spray Place 2 sprays into both nostrils daily. AM     . hydrochlorothiazide (HYDRODIURIL) 25 MG tablet Take 25 mg by mouth daily.    Marland Kitchen levocetirizine (XYZAL) 5 MG tablet Take 5 mg by mouth every evening.    . meloxicam (MOBIC) 15 MG tablet Take 15 mg by mouth daily.    . pantoprazole (PROTONIX) 40 MG tablet Take 40 mg by mouth daily.     No current facility-administered medications for this visit.  OBJECTIVE: There were no vitals filed for this visit.   There is no height or weight on file to calculate BMI.    ECOG FS:0 - Asymptomatic  General: Well-developed, well-nourished, no acute distress. Eyes: Pink conjunctiva, anicteric sclera. Lungs: Clear to auscultation bilaterally. Heart: Regular rate and rhythm. No rubs, murmurs, or gallops. Abdomen: Soft, nontender, nondistended. No organomegaly noted, normoactive bowel sounds. Musculoskeletal: No edema, cyanosis, or clubbing. Neuro:  Alert, answering all questions appropriately. Cranial nerves grossly intact. Skin: No rashes or petechiae noted. Psych: Normal affect.   LAB RESULTS:  Lab Results  Component Value Date   NA 137 05/12/2016   K 4.0 05/12/2016   CL 105 05/12/2016   CO2 27 05/12/2016   GLUCOSE 123 (H) 05/12/2016   BUN 22 (H) 05/12/2016   CREATININE 0.81 05/12/2016   CALCIUM 9.0 05/12/2016   PROT 8.6 (H) 10/08/2014   ALBUMIN 3.9 10/08/2014   AST 28 10/08/2014   ALT 34 10/08/2014   ALKPHOS 43 10/08/2014   BILITOT 0.7 10/08/2014   GFRNONAA >60 05/12/2016   GFRAA >60 05/12/2016    Lab Results  Component Value Date   WBC 5.0 05/12/2016   NEUTROABS 2.2 05/06/2015   HGB 12.2 (L) 05/12/2016   HCT 35.9 (L) 05/12/2016   MCV 90.2 05/12/2016   PLT 188 05/12/2016   Lab Results  Component Value Date   TOTALPROTELP 7.7 05/12/2016   ALBUMINELP 3.6 05/12/2016   A1GS 0.2 05/12/2016   A2GS 0.7 05/12/2016   BETS 3.0 (H) 05/12/2016   GAMS 0.2 (L) 05/12/2016   MSPIKE 1.8 (H) 05/12/2016   SPEI Comment 05/12/2016      STUDIES: No results found.  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  PLAN:    1. 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 His serum M spike is stable at 1.8. He has no evidence of endorgan damage. Metastatic bone survey in 2015 with no worrisome lesions. This does not need to be repeated unless there is suspicion of progression of disease. After lengthy discussion, patient wishes to remain on annual follow-up despite acknowledging the risk of possible conversion to a more aggressive form of myeloma. He will return to clinic in 1 year for repeat laboratory work and further evaluation.  2. Fluid intake: discussed plan to ramp up water intake.  Patient expressed understanding and was in agreement with this plan. He also understands that He can call clinic at any time with any questions, concerns, or complaints.     Timothy J Finnegan, MD 05/18/17 11:26 PM         

## 2017-05-20 ENCOUNTER — Inpatient Hospital Stay: Payer: Medicare Other | Admitting: Oncology

## 2017-05-20 ENCOUNTER — Other Ambulatory Visit: Payer: Medicare Other

## 2018-06-09 ENCOUNTER — Encounter: Payer: Self-pay | Admitting: *Deleted

## 2018-06-10 ENCOUNTER — Other Ambulatory Visit: Payer: Self-pay

## 2018-06-10 ENCOUNTER — Encounter: Payer: Self-pay | Admitting: *Deleted

## 2018-06-10 ENCOUNTER — Ambulatory Visit
Admission: RE | Admit: 2018-06-10 | Discharge: 2018-06-10 | Disposition: A | Discharge status: Home / Self Care | Payer: Medicare Other | Attending: Unknown Physician Specialty | Admitting: Unknown Physician Specialty

## 2018-06-10 ENCOUNTER — Ambulatory Visit: Payer: Medicare Other | Admitting: Anesthesiology

## 2018-06-10 ENCOUNTER — Encounter
Admission: RE | Disposition: A | Discharge status: Home / Self Care | Payer: Self-pay | Source: Home / Self Care | Attending: Unknown Physician Specialty

## 2018-06-10 DIAGNOSIS — Z791 Long term (current) use of non-steroidal anti-inflammatories (NSAID): Secondary | ICD-10-CM | POA: Insufficient documentation

## 2018-06-10 DIAGNOSIS — K219 Gastro-esophageal reflux disease without esophagitis: Secondary | ICD-10-CM | POA: Diagnosis not present

## 2018-06-10 DIAGNOSIS — D124 Benign neoplasm of descending colon: Secondary | ICD-10-CM | POA: Insufficient documentation

## 2018-06-10 DIAGNOSIS — D122 Benign neoplasm of ascending colon: Secondary | ICD-10-CM | POA: Insufficient documentation

## 2018-06-10 DIAGNOSIS — D123 Benign neoplasm of transverse colon: Secondary | ICD-10-CM | POA: Diagnosis not present

## 2018-06-10 DIAGNOSIS — Z86010 Personal history of colonic polyps: Secondary | ICD-10-CM | POA: Insufficient documentation

## 2018-06-10 DIAGNOSIS — Z79899 Other long term (current) drug therapy: Secondary | ICD-10-CM | POA: Diagnosis not present

## 2018-06-10 DIAGNOSIS — Z87891 Personal history of nicotine dependence: Secondary | ICD-10-CM | POA: Insufficient documentation

## 2018-06-10 DIAGNOSIS — Z1211 Encounter for screening for malignant neoplasm of colon: Secondary | ICD-10-CM | POA: Diagnosis not present

## 2018-06-10 DIAGNOSIS — I119 Hypertensive heart disease without heart failure: Secondary | ICD-10-CM | POA: Insufficient documentation

## 2018-06-10 DIAGNOSIS — K621 Rectal polyp: Secondary | ICD-10-CM | POA: Insufficient documentation

## 2018-06-10 HISTORY — DX: Umbilical hernia without obstruction or gangrene: K42.9

## 2018-06-10 HISTORY — PX: COLONOSCOPY WITH PROPOFOL: SHX5780

## 2018-06-10 HISTORY — DX: Hypertensive heart disease without heart failure: I11.9

## 2018-06-10 HISTORY — DX: Allergy status to unspecified drugs, medicaments and biological substances: Z88.9

## 2018-06-10 HISTORY — DX: Male erectile dysfunction, unspecified: N52.9

## 2018-06-10 SURGERY — COLONOSCOPY WITH PROPOFOL
Anesthesia: General

## 2018-06-10 MED ORDER — GLYCOPYRROLATE 0.2 MG/ML IJ SOLN
INTRAMUSCULAR | Status: AC
  Filled 2018-06-10: qty 1

## 2018-06-10 MED ORDER — SODIUM CHLORIDE 0.9 % IV SOLN: INTRAVENOUS | Status: DC

## 2018-06-10 MED ORDER — PROPOFOL 500 MG/50ML IV EMUL
INTRAVENOUS | Status: AC
  Filled 2018-06-10: qty 50

## 2018-06-10 MED ORDER — SODIUM CHLORIDE 0.9 % IV SOLN
INTRAVENOUS | Status: DC
  Administered 2018-06-10: 07:00:00 via INTRAVENOUS

## 2018-06-10 MED ORDER — PHENYLEPHRINE HCL 10 MG/ML IJ SOLN
INTRAMUSCULAR | Status: AC
  Filled 2018-06-10: qty 1

## 2018-06-10 MED ORDER — LIDOCAINE HCL (PF) 2 % IJ SOLN
INTRAMUSCULAR | Status: AC
  Filled 2018-06-10: qty 10

## 2018-06-10 MED ORDER — EPHEDRINE SULFATE 50 MG/ML IJ SOLN
INTRAMUSCULAR | Status: AC
  Filled 2018-06-10: qty 1

## 2018-06-10 MED ORDER — LIDOCAINE HCL (CARDIAC) PF 100 MG/5ML IV SOSY
PREFILLED_SYRINGE | INTRAVENOUS | Status: DC | PRN
  Administered 2018-06-10: 60 mg via INTRATRACHEAL

## 2018-06-10 MED ORDER — PHENYLEPHRINE HCL 10 MG/ML IJ SOLN
INTRAMUSCULAR | Status: DC | PRN
  Administered 2018-06-10 (×3): 100 ug via INTRAVENOUS
  Administered 2018-06-10: 50 ug via INTRAVENOUS

## 2018-06-10 MED ORDER — PROPOFOL 10 MG/ML IV BOLUS
INTRAVENOUS | Status: DC | PRN
  Administered 2018-06-10: 60 mg via INTRAVENOUS
  Administered 2018-06-10 (×2): 20 mg via INTRAVENOUS

## 2018-06-10 MED ORDER — EPHEDRINE SULFATE 50 MG/ML IJ SOLN
INTRAMUSCULAR | Status: DC | PRN
  Administered 2018-06-10 (×2): 10 mg via INTRAVENOUS

## 2018-06-10 MED ORDER — GLYCOPYRROLATE 0.2 MG/ML IJ SOLN
INTRAMUSCULAR | Status: DC | PRN
  Administered 2018-06-10: 0.1 mg via INTRAVENOUS

## 2018-06-10 MED ORDER — PROPOFOL 500 MG/50ML IV EMUL
INTRAVENOUS | Status: DC | PRN
  Administered 2018-06-10: 100 ug/kg/min via INTRAVENOUS

## 2018-06-10 NOTE — Anesthesia Preprocedure Evaluation (Signed)
Anesthesia Evaluation  Patient identified by MRN, date of birth, ID band Patient awake    Reviewed: Allergy & Precautions, NPO status , Patient's Chart, lab work & pertinent test results  History of Anesthesia Complications Negative for: history of anesthetic complications  Airway Mallampati: III  TM Distance: >3 FB Neck ROM: Full    Dental  (+) Upper Dentures, Lower Dentures   Pulmonary neg sleep apnea, neg COPD, former smoker,    breath sounds clear to auscultation- rhonchi (-) wheezing      Cardiovascular Exercise Tolerance: Good hypertension, Pt. on medications (-) CAD, (-) Past MI, (-) Cardiac Stents and (-) CABG  Rhythm:Regular Rate:Normal - Systolic murmurs and - Diastolic murmurs    Neuro/Psych neg Seizures negative neurological ROS  negative psych ROS   GI/Hepatic Neg liver ROS, GERD  ,  Endo/Other  negative endocrine ROSneg diabetes  Renal/GU negative Renal ROS     Musculoskeletal negative musculoskeletal ROS (+)   Abdominal (+) - obese,   Peds  Hematology  (+) anemia ,   Anesthesia Other Findings Past Medical History: No date: Allergic genetic state No date: Anemia No date: Erectile dysfunction No date: Full dentures     Comment:  upper and lower No date: GERD (gastroesophageal reflux disease) No date: Hearing aid worn No date: Hypertension No date: Hypertensive cardiovascular disease 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) No date: Umbilical hernia   Reproductive/Obstetrics                             Anesthesia Physical Anesthesia Plan  ASA: II  Anesthesia Plan: General   Post-op Pain Management:    Induction: Intravenous  PONV Risk Score and Plan: 1 and Propofol infusion  Airway Management Planned: Natural Airway  Additional Equipment:   Intra-op Plan:   Post-operative Plan:   Informed Consent: I have reviewed the patients  History and Physical, chart, labs and discussed the procedure including the risks, benefits and alternatives for the proposed anesthesia with the patient or authorized representative who has indicated his/her understanding and acceptance.     Dental advisory given  Plan Discussed with: CRNA and Anesthesiologist  Anesthesia Plan Comments:         Anesthesia Quick Evaluation

## 2018-06-10 NOTE — H&P (Signed)
Primary Care Physician:  Glendon Axe, MD Primary Gastroenterologist:  Dr. Vira Agar  Pre-Procedure History & Physical: HPI:  Austin Evans is a 78 y.o. male is here for an colonoscopy.   Past Medical History:  Diagnosis Date  . Allergic genetic state   . Anemia   . Erectile dysfunction   . Full dentures    upper and lower  . GERD (gastroesophageal reflux disease)   . Hearing aid worn   . Hypertension   . Hypertensive cardiovascular disease   . MGUS (monoclonal gammopathy of unknown significance)   . Umbilical hernia     Past Surgical History:  Procedure Laterality Date  . COLONOSCOPY    . ESOPHAGOGASTRODUODENOSCOPY N/A 11/30/2014   Procedure: ESOPHAGOGASTRODUODENOSCOPY (EGD) with dialtion;  Surgeon: Lucilla Lame, MD;  Location: [facility];  Service: Gastroenterology;  Laterality: N/A;  . EYE SURGERY Bilateral    cataracts  . FOREIGN BODY REMOVAL N/A 10/08/2014   Procedure: upper endoscopy with FOREIGN BODY REMOVAL;  Surgeon: Lucilla Lame, MD;  Location: [facility] ENDOSCOPY;  Service: Endoscopy;  Laterality: N/A;  . HERNIA REPAIR      Prior to Admission medications   Medication Sig Start Date End Date Taking? Authorizing Provider  allopurinol (ZYLOPRIM) 100 MG tablet Take 100 mg by mouth daily.   Yes [provider]  amLODipine (NORVASC) 5 MG tablet Take 5 mg by mouth daily.   Yes [provider]  benazepril (LOTENSIN) 40 MG tablet Take 40 mg by mouth daily.   Yes [provider]  Calcium Carbonate-Vitamin D 600-400 MG-UNIT tablet Take 1 tablet by mouth daily.   Yes [provider]  colchicine 0.6 MG tablet Take 0.6 mg by mouth 2 (two) times daily.   Yes [provider]  felodipine (PLENDIL) 5 MG 24 hr tablet Take 5 mg by mouth daily.   Yes [provider]  fluticasone (FLONASE) 50 MCG/ACT nasal spray Place 2 sprays into both nostrils daily. AM    Yes [provider]  hydrochlorothiazide (HYDRODIURIL) 25 MG  tablet Take 25 mg by mouth daily.   Yes [provider]  levocetirizine (XYZAL) 5 MG tablet Take 5 mg by mouth every evening.   Yes [provider]  meloxicam (MOBIC) 15 MG tablet Take 15 mg by mouth daily.   Yes [provider]  pantoprazole (PROTONIX) 40 MG tablet Take 40 mg by mouth daily.   Yes [provider]    Allergies as of 03/11/2018  . (No Known Allergies)    Family History  Problem Relation Age of Onset  . Dementia Mother   . Emphysema Father     Social History   Socioeconomic History  . Marital status: Married    Spouse name: Not on file  . Number of children: Not on file  . Years of education: Not on file  . Highest education level: Not on file  Occupational History  . Not on file  Social Needs  . Financial resource strain: Not on file  . Food insecurity:    Worry: Not on file    Inability: Not on file  . Transportation needs:    Medical: Not on file    Non-medical: Not on file  Tobacco Use  . Smoking status: Former Smoker    Last attempt to quit: 05/26/2003    Years since quitting: 15.0  . Smokeless tobacco: Never Used  Substance and Sexual Activity  . Alcohol use: Yes    Alcohol/week: 10.0 standard drinks  Types: 10 Shots of liquor per week  . Drug use: No  . Sexual activity: Not on file  Lifestyle  . Physical activity:    Days per week: Not on file    Minutes per session: Not on file  . Stress: Not on file  Relationships  . Social connections:    Talks on phone: Not on file    Gets together: Not on file    Attends religious service: Not on file    Active member of club or organization: Not on file    Attends meetings of clubs or organizations: Not on file    Relationship status: Not on file  . Intimate partner violence:    Fear of current or ex partner: Not on file    Emotionally abused: Not on file    Physically abused: Not on file    Forced sexual activity: Not on file  Other Topics Concern  . Not  on file  Social History Narrative  . Not on file    Review of Systems: See HPI, otherwise negative ROS  Physical Exam: BP (!) 151/100   Pulse 67   Temp (!) 96.6 F (35.9 C) (Tympanic)   Resp 16   Ht 5\' 8"  (1.727 m)   Wt 86.2 kg   SpO2 99%   BMI 28.89 kg/m  General:   Alert,  pleasant and cooperative in NAD Head:  Normocephalic and atraumatic. Neck:  Supple; no masses or thyromegaly. Lungs:  Clear throughout to auscultation.    Heart:  Regular rate and rhythm. Abdomen:  Soft, nontender and nondistended. Normal bowel sounds, without guarding, and without rebound.   Neurologic:  Alert and  oriented x4;  grossly normal neurologically.  Impression/Plan: Austin Evans is here for an colonoscopy to be performed for [facility] colon polyps, last one 03/24/2013.  Risks, benefits, limitations, and alternatives regarding  colonoscopy have been reviewed with the patient.  Questions have been answered.  All parties agreeable.   Gaylyn Cheers, MD  06/10/2018, 7:32 AM

## 2018-06-10 NOTE — Transfer of Care (Signed)
Immediate Anesthesia Transfer of Care Note  Patient: Austin Evans  Procedure(s) Performed: COLONOSCOPY WITH PROPOFOL (N/A )  Patient Location: Endoscopy Unit  Anesthesia Type:General  Level of Consciousness: drowsy  Airway & Oxygen Therapy: Patient Spontanous Breathing and Patient connected to nasal cannula oxygen  Post-op Assessment: Report given to RN and Post -op Vital signs reviewed and stable  Post vital signs: stable  Last Vitals:  Vitals Value Taken Time  BP 142/75 06/10/2018  8:12 AM  Temp 36.2 C 06/10/2018  8:11 AM  Pulse 30 06/10/2018  8:12 AM  Resp 21 06/10/2018  8:13 AM  SpO2 97 % 06/10/2018  8:12 AM  Vitals shown include unvalidated device data.  Last Pain:  Vitals:   06/10/18 0811  TempSrc: Tympanic  PainSc:          Complications: No apparent anesthesia complications

## 2018-06-10 NOTE — Anesthesia Post-op Follow-up Note (Signed)
Anesthesia QCDR form completed.        

## 2018-06-10 NOTE — Op Note (Signed)
[facility] Gastroenterology Patient Name: Austin Evans Procedure Date: 06/10/2018 7:34 AM MRN: [id number] Account #: [id number] Date of Birth: 01-30-1941 Admit Type: Outpatient Age: 78 Room: [facility] ENDO ROOM 3 Gender: Male Note Status: Finalized Procedure:            Colonoscopy Indications:          High risk colon cancer surveillance: Personal history                        of colonic polyps Providers:            Manya Silvas, MD Referring MD:         No Local Md, MD (Referring MD) Medicines:            Propofol per Anesthesia Complications:        No immediate complications. Procedure:            Pre-Anesthesia Assessment:                       - After reviewing the risks and benefits, the patient                        was deemed in satisfactory condition to undergo the                        procedure.                       After obtaining informed consent, the colonoscope was                        passed under direct vision. Throughout the procedure,                        the patient's blood pressure, pulse, and oxygen                        saturations were monitored continuously. The                        Colonoscope was introduced through the anus and                        advanced to the the cecum, identified by appendiceal                        orifice and ileocecal valve. The colonoscopy was                        performed without difficulty. The patient tolerated the                        procedure well. The quality of the bowel preparation                        was good. Findings:      Three sessile polyps were found in the rectum. The polyps were       diminutive in size. These polyps were removed with a jumbo cold forceps.       Resection and retrieval were complete.      A diminutive polyp  was found in the splenic flexure. The polyp was       sessile. The polyp was removed with a jumbo cold forceps. Resection and       retrieval were  complete.      A diminutive polyp was found in the ascending colon. The polyp was       sessile. The polyp was removed with a jumbo cold forceps. Resection and       retrieval were complete.      Two sessile polyps were found in the ascending colon. The polyps were       small in size. These polyps were removed with a hot snare. Resection and       retrieval were complete.      A small polyp was found in the ascending colon. The polyp was sessile.       The polyp was removed with a hot snare. Resection and retrieval were       complete.      Two sessile polyps were found in the descending colon. The polyps were       diminutive in size. These polyps were removed with a jumbo cold forceps.       Resection and retrieval were complete.      A lipoma was seen in recto-sigmoid area.      this is harmless and was not removed. Impression:           - Three diminutive polyps in the rectum, removed with a                        jumbo cold forceps. Resected and retrieved.                       - One diminutive polyp at the splenic flexure, removed                        with a jumbo cold forceps. Resected and retrieved.                       - One diminutive polyp in the ascending colon, removed                        with a jumbo cold forceps. Resected and retrieved.                       - Two small polyps in the ascending colon, removed with                        a hot snare. Resected and retrieved.                       - One small polyp in the ascending colon, removed with                        a hot snare. Resected and retrieved.                       - One diminutive polyp in the descending colon.                       - Two diminutive polyps in the descending colon,  removed with a jumbo cold forceps. Resected and                        retrieved. Recommendation:       - Await pathology results. Manya Silvas, MD 06/10/2018 8:14:53 AM This report has been  signed electronically. Number of Addenda: 0 Note Initiated On: 06/10/2018 7:34 AM Scope Withdrawal Time: 0 hours 14 minutes 51 seconds  Total Procedure Duration: 0 hours 25 minutes 31 seconds       [facility]

## 2018-06-10 NOTE — Anesthesia Postprocedure Evaluation (Signed)
Anesthesia Post Note  Patient: Austin Evans  Procedure(s) Performed: COLONOSCOPY WITH PROPOFOL (N/A )  Patient location during evaluation: Endoscopy Anesthesia Type: General Level of consciousness: awake and alert and oriented Pain management: pain level controlled Vital Signs Assessment: post-procedure vital signs reviewed and stable Respiratory status: spontaneous breathing, nonlabored ventilation and respiratory function stable Cardiovascular status: blood pressure returned to baseline and stable Postop Assessment: no signs of nausea or vomiting Anesthetic complications: no     Last Vitals:  Vitals:   06/10/18 0812 06/10/18 0821  BP: (!) 142/75 115/74  Pulse: (!) 30   Resp: 18   Temp:    SpO2: 97%     Last Pain:  Vitals:   06/10/18 0831  TempSrc:   PainSc: 0-No pain                 Cylis Ayars

## 2018-06-13 LAB — SURGICAL PATHOLOGY

## 2020-04-06 ENCOUNTER — Emergency Department
Admission: EM | Admit: 2020-04-06 | Discharge: 2020-04-06 | Disposition: A | Discharge status: Home / Self Care | Payer: Medicare Other | Attending: Emergency Medicine | Admitting: Emergency Medicine

## 2020-04-06 ENCOUNTER — Emergency Department: Payer: Medicare Other

## 2020-04-06 ENCOUNTER — Other Ambulatory Visit: Payer: Self-pay

## 2020-04-06 DIAGNOSIS — W5501XA Bitten by cat, initial encounter: Secondary | ICD-10-CM | POA: Insufficient documentation

## 2020-04-06 DIAGNOSIS — L03113 Cellulitis of right upper limb: Secondary | ICD-10-CM | POA: Insufficient documentation

## 2020-04-06 DIAGNOSIS — Z79899 Other long term (current) drug therapy: Secondary | ICD-10-CM | POA: Diagnosis not present

## 2020-04-06 DIAGNOSIS — Z87891 Personal history of nicotine dependence: Secondary | ICD-10-CM | POA: Diagnosis not present

## 2020-04-06 DIAGNOSIS — I110 Hypertensive heart disease with heart failure: Secondary | ICD-10-CM | POA: Insufficient documentation

## 2020-04-06 DIAGNOSIS — I509 Heart failure, unspecified: Secondary | ICD-10-CM | POA: Diagnosis not present

## 2020-04-06 DIAGNOSIS — M79641 Pain in right hand: Secondary | ICD-10-CM | POA: Diagnosis present

## 2020-04-06 LAB — COMPREHENSIVE METABOLIC PANEL
ALT: 27 U/L (ref 0–44)
AST: 25 U/L (ref 15–41)
Albumin: 3.8 g/dL (ref 3.5–5.0)
Alkaline Phosphatase: 49 U/L (ref 38–126)
Anion gap: 8 (ref 5–15)
BUN: 27 mg/dL — ABNORMAL HIGH (ref 8–23)
CO2: 25 mmol/L (ref 22–32)
Calcium: 8.9 mg/dL (ref 8.9–10.3)
Chloride: 102 mmol/L (ref 98–111)
Creatinine, Ser: 0.91 mg/dL (ref 0.61–1.24)
GFR, Estimated: >60 mL/min (ref >60)
Glucose, Bld: 111 mg/dL — ABNORMAL HIGH (ref 70–99)
Potassium: 3.6 mmol/L (ref 3.5–5.1)
Sodium: 135 mmol/L (ref 135–145)
Total Bilirubin: 0.7 mg/dL (ref 0.3–1.2)
Total Protein: 8.5 g/dL — ABNORMAL HIGH (ref 6.5–8.1)

## 2020-04-06 LAB — CBC WITH DIFFERENTIAL/PLATELET
Abs Immature Granulocytes: 0.02 10*3/uL (ref 0.00–0.07)
Basophils Absolute: 0 10*3/uL (ref 0.0–0.1)
Basophils Relative: 0 %
Eosinophils Absolute: 0.2 10*3/uL (ref 0.0–0.5)
Eosinophils Relative: 2 %
HCT: 35.3 % — ABNORMAL LOW (ref 39.0–52.0)
Hemoglobin: 11.3 g/dL — ABNORMAL LOW (ref 13.0–17.0)
Immature Granulocytes: 0 %
Lymphocytes Relative: 33 %
Lymphs Abs: 2.4 10*3/uL (ref 0.7–4.0)
MCH: 29.8 pg (ref 26.0–34.0)
MCHC: 32 g/dL (ref 30.0–36.0)
MCV: 93.1 fL (ref 80.0–100.0)
Monocytes Absolute: 0.6 10*3/uL (ref 0.1–1.0)
Monocytes Relative: 8 %
Neutro Abs: 4.1 10*3/uL (ref 1.7–7.7)
Neutrophils Relative %: 57 %
Platelets: 178 10*3/uL (ref 150–400)
RBC: 3.79 MIL/uL — ABNORMAL LOW (ref 4.22–5.81)
RDW: 16.1 % — ABNORMAL HIGH (ref 11.5–15.5)
WBC: 7.2 10*3/uL (ref 4.0–10.5)
nRBC: 0 % (ref 0.0–0.2)

## 2020-04-06 LAB — LACTIC ACID, PLASMA: Lactic Acid, Venous: 1 mmol/L (ref 0.5–1.9)

## 2020-04-06 MED ORDER — AMOXICILLIN-POT CLAVULANATE 875-125 MG PO TABS: 1.0000 | ORAL_TABLET | Freq: Two times a day (BID) | ORAL | 0 refills | Status: AC

## 2020-04-06 MED ORDER — SULFAMETHOXAZOLE-TRIMETHOPRIM 800-160 MG PO TABS
1.0000 | ORAL_TABLET | Freq: Once | ORAL | Status: AC
  Administered 2020-04-06: 1 via ORAL
  Filled 2020-04-06: qty 1

## 2020-04-06 MED ORDER — SODIUM CHLORIDE 0.9 % IV SOLN
3.0000 g | Freq: Once | INTRAVENOUS | Status: AC
  Administered 2020-04-06: 3 g via INTRAVENOUS
  Filled 2020-04-06: qty 8

## 2020-04-06 MED ORDER — BACITRACIN-NEOMYCIN-POLYMYXIN 400-5-5000 EX OINT
TOPICAL_OINTMENT | Freq: Once | CUTANEOUS | Status: AC
  Administered 2020-04-06: 1 via TOPICAL
  Filled 2020-04-06: qty 1

## 2020-04-06 MED ORDER — AMOXICILLIN-POT CLAVULANATE 875-125 MG PO TABS
1.0000 | ORAL_TABLET | Freq: Once | ORAL | Status: AC
  Administered 2020-04-06: 1 via ORAL
  Filled 2020-04-06: qty 1

## 2020-04-06 MED ORDER — IOHEXOL 300 MG/ML  SOLN
75.0000 mL | Freq: Once | INTRAMUSCULAR | Status: AC | PRN
  Administered 2020-04-06: 75 mL via INTRAVENOUS
  Filled 2020-04-06: qty 75

## 2020-04-06 MED ORDER — SULFAMETHOXAZOLE-TRIMETHOPRIM 800-160 MG PO TABS: 1.0000 | ORAL_TABLET | Freq: Two times a day (BID) | ORAL | 0 refills | Status: AC

## 2020-04-06 NOTE — ED Triage Notes (Signed)
Pt has cat bite to right hand with some redness and swelling. Happened last night.

## 2020-04-06 NOTE — ED Provider Notes (Signed)
[facility] Emergency Department Provider Note  ____________________________________________   First MD Initiated Contact with Patient 04/06/20 1816     (approximate)  I have reviewed the triage vital signs and the nursing notes.   HISTORY  Chief Complaint Animal Bite  HPI Austin Evans is a 79 y.o. male who presents to the emergency department for evaluation of cat bite.  The patient states that his own cat bit him into areas of his right hand last night around 10 PM.  The cat is up-to-date on his regular vaccinations.  Patient states that he came in because he was having increasing pain and swelling to the right hand region.  His pain is currently rated a 4/10 located in the right hand.  The pain is made worse with flexion of the digits, nothing makes it better.  Patient's Tdap is up-to-date in the last 5 years.       Past Medical History:  Diagnosis Date  . Allergic genetic state   . Anemia   . Erectile dysfunction   . Full dentures    upper and lower  . GERD (gastroesophageal reflux disease)   . Hearing aid worn   . Hypertension   . Hypertensive cardiovascular disease   . MGUS (monoclonal gammopathy of unknown significance)   . Umbilical hernia     Patient Active Problem List   Diagnosis Date Noted  . Monoclonal gammopathy of unknown significance (MGUS) 05/19/2016  . Difficulty in swallowing   . Stricture and stenosis of esophagus   . Gastritis   . Disorder of duodenum     Past Surgical History:  Procedure Laterality Date  . COLONOSCOPY    . COLONOSCOPY WITH PROPOFOL N/A 06/10/2018   Procedure: COLONOSCOPY WITH PROPOFOL;  Surgeon: Manya Silvas, MD;  Location: [facility] ENDOSCOPY;  Service: Endoscopy;  Laterality: N/A;  . ESOPHAGOGASTRODUODENOSCOPY N/A 11/30/2014   Procedure: ESOPHAGOGASTRODUODENOSCOPY (EGD) with dialtion;  Surgeon: Lucilla Lame, MD;  Location: [place];  Service: Gastroenterology;  Laterality: N/A;  . EYE SURGERY  Bilateral    cataracts  . FOREIGN BODY REMOVAL N/A 10/08/2014   Procedure: upper endoscopy with FOREIGN BODY REMOVAL;  Surgeon: Lucilla Lame, MD;  Location: [facility] ENDOSCOPY;  Service: Endoscopy;  Laterality: N/A;  . HERNIA REPAIR      Prior to Admission medications   Medication Sig Start Date End Date Taking? Authorizing Provider  allopurinol (ZYLOPRIM) 100 MG tablet Take 100 mg by mouth daily.    [provider]  amLODipine (NORVASC) 5 MG tablet Take 5 mg by mouth daily.    [provider]  amoxicillin-clavulanate (AUGMENTIN) 875-125 MG tablet Take 1 tablet by mouth 2 (two) times daily for 10 days. 04/06/20 04/16/20  Marlana Salvage, PA  benazepril (LOTENSIN) 40 MG tablet Take 40 mg by mouth daily.    [provider]  Calcium Carbonate-Vitamin D 600-400 MG-UNIT tablet Take 1 tablet by mouth daily.    [provider]  colchicine 0.6 MG tablet Take 0.6 mg by mouth 2 (two) times daily.    [provider]  felodipine (PLENDIL) 5 MG 24 hr tablet Take 5 mg by mouth daily.    [provider]  fluticasone (FLONASE) 50 MCG/ACT nasal spray Place 2 sprays into both nostrils daily. AM     [provider]  hydrochlorothiazide (HYDRODIURIL) 25 MG tablet Take 25 mg by mouth daily.    [provider]  levocetirizine (XYZAL) 5 MG tablet Take 5 mg by mouth  every evening.    [provider]  meloxicam (MOBIC) 15 MG tablet Take 15 mg by mouth daily.    [provider]  pantoprazole (PROTONIX) 40 MG tablet Take 40 mg by mouth daily.    [provider]  sulfamethoxazole-trimethoprim (BACTRIM DS) 800-160 MG tablet Take 1 tablet by mouth 2 (two) times daily for 10 days. 04/06/20 04/16/20  Marlana Salvage, PA    Allergies Patient has no known allergies.  Family History  Problem Relation Age of Onset  . Dementia Mother   . Emphysema Father     Social History Social History   Tobacco Use  . Smoking  status: Former Smoker    Quit date: 05/26/2003    Years since quitting: 16.8  . Smokeless tobacco: Never Used  Vaping Use  . Vaping Use: Never used  Substance Use Topics  . Alcohol use: Yes    Alcohol/week: 10.0 standard drinks    Types: 10 Shots of liquor per week  . Drug use: No    Review of Systems Constitutional: No fever, +chills Eyes: No visual changes. ENT: No sore throat. Cardiovascular: Denies chest pain. Respiratory: Denies shortness of breath. Gastrointestinal: No abdominal pain.  No nausea, no vomiting.  No diarrhea.  No constipation. Genitourinary: Negative for dysuria. Musculoskeletal: + Right hand and wrist pain, negative for back pain. Skin: + Redness right arm Neurological: Negative for headaches, focal weakness or numbness.  ____________________________________________   PHYSICAL EXAM:  VITAL SIGNS: ED Triage Vitals  Enc Vitals Group     BP 04/06/20 1737 (!) 199/80     Pulse Rate 04/06/20 1737 88     Resp 04/06/20 1737 18     Temp 04/06/20 1737 99 F (37.2 C)     Temp Source 04/06/20 1737 Oral     SpO2 04/06/20 1737 95 %     Weight 04/06/20 1738 178 lb (80.7 kg)     Height 04/06/20 1738 5\' 8"  (1.727 m)     Head Circumference --      Peak Flow --      Pain Score 04/06/20 1738 4     Pain Loc --      Pain Edu? --      Excl. in [place]? --     Constitutional: Alert and oriented. Well appearing and in no acute distress. Eyes: Conjunctivae are normal. PERRL. EOMI. Head: Atraumatic. Nose: No congestion/rhinnorhea. Mouth/Throat: Mucous membranes are moist.   Neck: No stridor.   Cardiovascular: Normal rate, regular rhythm. Grossly normal heart sounds.  Good peripheral circulation. Respiratory: Normal respiratory effort.  No retractions. Lungs CTAB. Gastrointestinal: Soft and nontender. No distention. No abdominal bruits. No CVA tenderness. Musculoskeletal: There is redness and swelling about the right hand with 2 puncture wounds on the dorsum of the right  hand, just distal to the carpal bones.  Some redness extends into the first and second digits distally and there appears to be streaking of the redness along the anterior forearm in a diagonal pattern towards the medial epicondyle.  There is no erythema proximal to the elbow joint at this time.  The patient maintains full active range of motion of the digits of the right hand and the right wrist, however has increased pain with flexion of the digits.  Radial pulses 2+. Neurologic:  Normal speech and language. No gross focal neurologic deficits are appreciated. No gait instability. Skin: 2 puncture wounds of the right hand with surrounding erythema and swelling with appearance of streaking as described  above. Psychiatric: Mood and affect are normal. Speech and behavior are normal.  ____________________________________________   LABS (all labs ordered are listed, but only abnormal results are displayed)  Labs Reviewed  COMPREHENSIVE METABOLIC PANEL - Abnormal; Notable for the following components:      Result Value   Glucose, Bld 111 (*)    BUN 27 (*)    Total Protein 8.5 (*)    All other components within normal limits  CBC WITH DIFFERENTIAL/PLATELET - Abnormal; Notable for the following components:   RBC 3.79 (*)    Hemoglobin 11.3 (*)    HCT 35.3 (*)    RDW 16.1 (*)    All other components within normal limits  CULTURE, BLOOD (ROUTINE X 2)  CULTURE, BLOOD (ROUTINE X 2)  LACTIC ACID, PLASMA   ____________________________________________  RADIOLOGY I, Marlana Salvage, personally viewed and evaluated these images (plain radiographs) as part of my medical decision making, as well as reviewing the written report by the radiologist.  ED provider interpretation: Right hand and wrist x-rays reveal soft tissue swelling with no acute fractures were identified foreign bodies.  Official radiology report(s): DG Wrist Complete Right  Result Date: 04/06/2020 CLINICAL DATA:  Cap bite.  Soft  tissue swelling. EXAM: RIGHT WRIST - COMPLETE 3+ VIEW; RIGHT HAND - COMPLETE 3+ VIEW COMPARISON:  None. FINDINGS: There is no acute displaced fracture or dislocation. No radiopaque foreign body. There is soft tissue swelling without radiographic evidence for osteomyelitis. There are no pockets of subcutaneous gas. Vascular calcifications are noted. IMPRESSION: Soft tissue swelling without radiographic evidence for osteomyelitis. No radiopaque foreign body. Electronically Signed   By: Constance Holster M.D.   On: 04/06/2020 18:59   DG Hand Complete Right  Result Date: 04/06/2020 CLINICAL DATA:  Cap bite.  Soft tissue swelling. EXAM: RIGHT WRIST - COMPLETE 3+ VIEW; RIGHT HAND - COMPLETE 3+ VIEW COMPARISON:  None. FINDINGS: There is no acute displaced fracture or dislocation. No radiopaque foreign body. There is soft tissue swelling without radiographic evidence for osteomyelitis. There are no pockets of subcutaneous gas. Vascular calcifications are noted. IMPRESSION: Soft tissue swelling without radiographic evidence for osteomyelitis. No radiopaque foreign body. Electronically Signed   By: Constance Holster M.D.   On: 04/06/2020 18:59   CT Extrem Up Entire Arm R W/CM  Result Date: 04/06/2020 CLINICAL DATA:  Cat bite, erythema and swelling EXAM: CT OF THE UPPER RIGHT EXTREMITY WITH CONTRAST TECHNIQUE: Multidetector CT imaging of the upper right extremity was performed according to the standard protocol following intravenous contrast administration. CONTRAST:  21mL OMNIPAQUE IOHEXOL 300 MG/ML  SOLN COMPARISON:  04/06/2020 FINDINGS: Bones/Joint/Cartilage No acute or destructive bony lesions. Right shoulder, elbow, and wrist are well aligned. Joint spaces are well preserved. Ligaments Suboptimally assessed by CT. Muscles and Tendons Unremarkable Soft tissues No fluid collection or abscess. Diffuse subcutaneous fat stranding throughout the dorsum of the hand and wrist. Remaining soft tissues are unremarkable.  Reconstructed images demonstrate no additional findings. IMPRESSION: 1. Subcutaneous edema throughout the dorsum of the hand and wrist. 2. No fluid collection or abscess. 3. No acute or destructive bony lesions. Electronically Signed   By: Randa Ngo M.D.   On: 04/06/2020 22:27    ____________________________________________   INITIAL IMPRESSION / ASSESSMENT AND PLAN / ED COURSE  As part of my medical decision making, I reviewed the following data within the [place] notes reviewed and incorporated, Labs reviewed  and Radiograph reviewed  Patient is a 79 year old male who presents to emergency department for evaluation of cat bite that occurred around 10 PM last night.  See HPI for further details.  On physical exam, the patient has 2 puncture wounds of the right hand with surrounding erythema and swelling as well as associated tenderness.  There appears to be a streaking appearance towards the medial epicondyle however initially there is no erythema noted above the elbow joint.  Given the streaking appearance, labs were obtained with a CBC, CMP, lactic and blood cultures.  The patient was also started on IV Unasyn.  Patient's labs returned and he does not have an elevated white count, and his lactic is normal.  After these labs, the plan was to discharge the patient on home Augmentin and have him follow-up with primary care.  However, upon going to discuss this with the patient, his arm was reexamined and the erythema of his right arm had extended an additional 2 to 3 inches above the antecubital fossa that was not present previously.  At this time, the sites of the patient's erythema were marked on his arm as well as separately marking the area of swelling on his hand and wrist.  A CT with contrast of the right upper extremity was also obtained to evaluate for any abscess or other more significant infection.  This was negative for any drainable fluid collection  and demonstrated the soft tissue swelling that was previously seen on x-ray.  Given the stable vital signs, unremarkable labs and imaging, I do feel this patient is stable for outpatient therapy, however we will discharge him on 2 antibiotics with Augmentin and Bactrim.  The patient was instructed he should follow-up with his primary care and will return to the emergency department if there are any extensions of his swelling or erythema from the lines marked or if he has any other systemic symptoms including fever etc.  The patient is amenable with this plan is stable at this time for outpatient therapy.      ____________________________________________   FINAL CLINICAL IMPRESSION(S) / ED DIAGNOSES  Final diagnoses:  Cat bite, initial encounter  Cellulitis of right upper extremity     ED Discharge Orders         Ordered    amoxicillin-clavulanate (AUGMENTIN) 875-125 MG tablet  2 times daily        04/06/20 2235    sulfamethoxazole-trimethoprim (BACTRIM DS) 800-160 MG tablet  2 times daily        04/06/20 2235          *Please note:  Austin Evans was evaluated in Emergency Department on 04/06/2020 for the symptoms described in the history of present illness. He was evaluated in the context of the global COVID-19 pandemic, which necessitated consideration that the patient might be at risk for infection with the SARS-CoV-2 virus that causes COVID-19. Institutional protocols and algorithms that pertain to the evaluation of patients at risk for COVID-19 are in a state of rapid change based on information released by regulatory bodies including the CDC and federal and state organizations. These policies and algorithms were followed during the patient's care in the ED.  Some ED evaluations and interventions may be delayed as a result of limited staffing during and the pandemic.*   Note:  This document was prepared using Dragon voice recognition software and may include unintentional dictation  errors.    Marlana Salvage, PA 04/06/20 5102    Delman Kitten, MD 04/08/20 [phone]

## 2020-04-06 NOTE — ED Notes (Signed)
Pt missed his most recent dose of BP meds

## 2020-04-06 NOTE — Discharge Instructions (Addendum)
Please monitor your right arm redness and swelling.  If it extends beyond the marked area, please return to the emergency department.  Please take your to antibiotics as prescribed.

## 2020-04-10 LAB — CULTURE, BLOOD (ROUTINE X 2)

## 2020-04-11 LAB — CULTURE, BLOOD (ROUTINE X 2)
Culture: NO GROWTH
Culture: NO GROWTH
Special Requests: ADEQUATE

## 2020-06-25 DIAGNOSIS — R7301 Impaired fasting glucose: Secondary | ICD-10-CM | POA: Diagnosis not present

## 2020-06-25 DIAGNOSIS — D472 Monoclonal gammopathy: Secondary | ICD-10-CM | POA: Diagnosis not present

## 2020-06-25 DIAGNOSIS — M109 Gout, unspecified: Secondary | ICD-10-CM | POA: Diagnosis not present

## 2020-06-25 DIAGNOSIS — E785 Hyperlipidemia, unspecified: Secondary | ICD-10-CM | POA: Diagnosis not present

## 2020-06-25 DIAGNOSIS — I10 Essential (primary) hypertension: Secondary | ICD-10-CM | POA: Diagnosis not present

## 2020-07-02 DIAGNOSIS — E785 Hyperlipidemia, unspecified: Secondary | ICD-10-CM | POA: Diagnosis not present

## 2020-07-02 DIAGNOSIS — R7301 Impaired fasting glucose: Secondary | ICD-10-CM | POA: Diagnosis not present

## 2020-07-02 DIAGNOSIS — I10 Essential (primary) hypertension: Secondary | ICD-10-CM | POA: Diagnosis not present

## 2020-07-02 DIAGNOSIS — D649 Anemia, unspecified: Secondary | ICD-10-CM | POA: Diagnosis not present

## 2020-07-02 DIAGNOSIS — K219 Gastro-esophageal reflux disease without esophagitis: Secondary | ICD-10-CM | POA: Diagnosis not present

## 2020-07-02 DIAGNOSIS — D472 Monoclonal gammopathy: Secondary | ICD-10-CM | POA: Diagnosis not present

## 2020-07-02 DIAGNOSIS — M109 Gout, unspecified: Secondary | ICD-10-CM | POA: Diagnosis not present

## 2020-07-31 DIAGNOSIS — H40013 Open angle with borderline findings, low risk, bilateral: Secondary | ICD-10-CM | POA: Diagnosis not present

## 2020-07-31 DIAGNOSIS — H26493 Other secondary cataract, bilateral: Secondary | ICD-10-CM | POA: Diagnosis not present

## 2020-12-23 DIAGNOSIS — R7301 Impaired fasting glucose: Secondary | ICD-10-CM | POA: Diagnosis not present

## 2020-12-23 DIAGNOSIS — I10 Essential (primary) hypertension: Secondary | ICD-10-CM | POA: Diagnosis not present

## 2020-12-23 DIAGNOSIS — E785 Hyperlipidemia, unspecified: Secondary | ICD-10-CM | POA: Diagnosis not present

## 2020-12-23 DIAGNOSIS — M109 Gout, unspecified: Secondary | ICD-10-CM | POA: Diagnosis not present

## 2020-12-23 DIAGNOSIS — D472 Monoclonal gammopathy: Secondary | ICD-10-CM | POA: Diagnosis not present

## 2020-12-23 DIAGNOSIS — D649 Anemia, unspecified: Secondary | ICD-10-CM | POA: Diagnosis not present

## 2020-12-30 DIAGNOSIS — Z0000 Encounter for general adult medical examination without abnormal findings: Secondary | ICD-10-CM | POA: Diagnosis not present

## 2020-12-30 DIAGNOSIS — Z1389 Encounter for screening for other disorder: Secondary | ICD-10-CM | POA: Diagnosis not present

## 2020-12-30 DIAGNOSIS — D692 Other nonthrombocytopenic purpura: Secondary | ICD-10-CM | POA: Diagnosis not present

## 2020-12-30 DIAGNOSIS — D649 Anemia, unspecified: Secondary | ICD-10-CM | POA: Diagnosis not present

## 2020-12-30 DIAGNOSIS — M109 Gout, unspecified: Secondary | ICD-10-CM | POA: Diagnosis not present

## 2020-12-30 DIAGNOSIS — R7301 Impaired fasting glucose: Secondary | ICD-10-CM | POA: Diagnosis not present

## 2020-12-30 DIAGNOSIS — I10 Essential (primary) hypertension: Secondary | ICD-10-CM | POA: Diagnosis not present

## 2020-12-30 DIAGNOSIS — D472 Monoclonal gammopathy: Secondary | ICD-10-CM | POA: Diagnosis not present

## 2021-02-03 DIAGNOSIS — D649 Anemia, unspecified: Secondary | ICD-10-CM | POA: Diagnosis not present

## 2021-02-03 DIAGNOSIS — M109 Gout, unspecified: Secondary | ICD-10-CM | POA: Diagnosis not present

## 2021-02-03 DIAGNOSIS — D472 Monoclonal gammopathy: Secondary | ICD-10-CM | POA: Diagnosis not present

## 2021-02-03 DIAGNOSIS — R7301 Impaired fasting glucose: Secondary | ICD-10-CM | POA: Diagnosis not present

## 2021-02-03 DIAGNOSIS — I10 Essential (primary) hypertension: Secondary | ICD-10-CM | POA: Diagnosis not present

## 2021-02-03 DIAGNOSIS — K219 Gastro-esophageal reflux disease without esophagitis: Secondary | ICD-10-CM | POA: Diagnosis not present

## 2021-02-24 DIAGNOSIS — I10 Essential (primary) hypertension: Secondary | ICD-10-CM | POA: Diagnosis not present

## 2021-07-21 DIAGNOSIS — M5136 Other intervertebral disc degeneration, lumbar region: Secondary | ICD-10-CM | POA: Diagnosis not present

## 2021-07-21 DIAGNOSIS — M47817 Spondylosis without myelopathy or radiculopathy, lumbosacral region: Secondary | ICD-10-CM | POA: Diagnosis not present

## 2021-07-21 DIAGNOSIS — M5441 Lumbago with sciatica, right side: Secondary | ICD-10-CM | POA: Diagnosis not present

## 2021-07-21 DIAGNOSIS — I10 Essential (primary) hypertension: Secondary | ICD-10-CM | POA: Diagnosis not present

## 2021-07-21 DIAGNOSIS — M5440 Lumbago with sciatica, unspecified side: Secondary | ICD-10-CM | POA: Diagnosis not present

## 2021-12-12 DIAGNOSIS — R509 Fever, unspecified: Secondary | ICD-10-CM | POA: Diagnosis not present

## 2021-12-12 DIAGNOSIS — J40 Bronchitis, not specified as acute or chronic: Secondary | ICD-10-CM | POA: Diagnosis not present

## 2021-12-22 DIAGNOSIS — H40013 Open angle with borderline findings, low risk, bilateral: Secondary | ICD-10-CM | POA: Diagnosis not present

## 2021-12-22 DIAGNOSIS — Z961 Presence of intraocular lens: Secondary | ICD-10-CM | POA: Diagnosis not present

## 2021-12-22 DIAGNOSIS — H26493 Other secondary cataract, bilateral: Secondary | ICD-10-CM | POA: Diagnosis not present

## 2021-12-24 DIAGNOSIS — M109 Gout, unspecified: Secondary | ICD-10-CM | POA: Diagnosis not present

## 2021-12-24 DIAGNOSIS — D472 Monoclonal gammopathy: Secondary | ICD-10-CM | POA: Diagnosis not present

## 2021-12-24 DIAGNOSIS — I10 Essential (primary) hypertension: Secondary | ICD-10-CM | POA: Diagnosis not present

## 2021-12-24 DIAGNOSIS — D649 Anemia, unspecified: Secondary | ICD-10-CM | POA: Diagnosis not present

## 2021-12-24 DIAGNOSIS — E785 Hyperlipidemia, unspecified: Secondary | ICD-10-CM | POA: Diagnosis not present

## 2021-12-24 DIAGNOSIS — R7301 Impaired fasting glucose: Secondary | ICD-10-CM | POA: Diagnosis not present

## 2022-01-01 DIAGNOSIS — M109 Gout, unspecified: Secondary | ICD-10-CM | POA: Diagnosis not present

## 2022-01-01 DIAGNOSIS — K219 Gastro-esophageal reflux disease without esophagitis: Secondary | ICD-10-CM | POA: Diagnosis not present

## 2022-01-01 DIAGNOSIS — E785 Hyperlipidemia, unspecified: Secondary | ICD-10-CM | POA: Diagnosis not present

## 2022-01-01 DIAGNOSIS — I10 Essential (primary) hypertension: Secondary | ICD-10-CM | POA: Diagnosis not present

## 2022-01-01 DIAGNOSIS — D472 Monoclonal gammopathy: Secondary | ICD-10-CM | POA: Diagnosis not present

## 2022-01-01 DIAGNOSIS — Z1389 Encounter for screening for other disorder: Secondary | ICD-10-CM | POA: Diagnosis not present

## 2022-01-01 DIAGNOSIS — D649 Anemia, unspecified: Secondary | ICD-10-CM | POA: Diagnosis not present

## 2022-01-01 DIAGNOSIS — R7303 Prediabetes: Secondary | ICD-10-CM | POA: Diagnosis not present

## 2022-01-01 DIAGNOSIS — Z0000 Encounter for general adult medical examination without abnormal findings: Secondary | ICD-10-CM | POA: Diagnosis not present

## 2022-01-01 IMAGING — CT CT EXTREM UP ENTIRE ARM*R* W/ CM
2 of 3 series · 9 of 35 positions shown, 11 images · IV contrast (omnipaque)
Comparison: 04/06/2020

CLINICAL DATA: Cat bite, erythema and swelling

EXAM:
CT OF THE UPPER RIGHT EXTREMITY WITH CONTRAST
TECHNIQUE: Multidetector CT imaging of the upper right extremity was performed
according to the standard protocol following intravenous contrast
administration.
CONTRAST:  75mL OMNIPAQUE IOHEXOL 300 MG/ML  SOLN

[Series 7: ax st · axial · 0.31mm/px · z∈[-789,-221]mm · 6 of 495 slices shown, 8 images]
[im 77/495  soft-tissue]
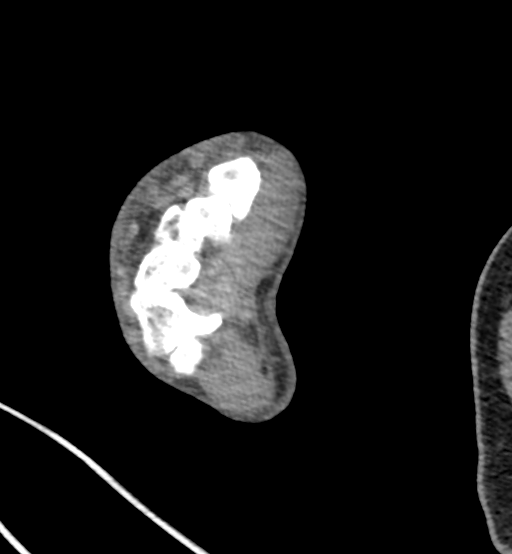
[im 77/495  bone]
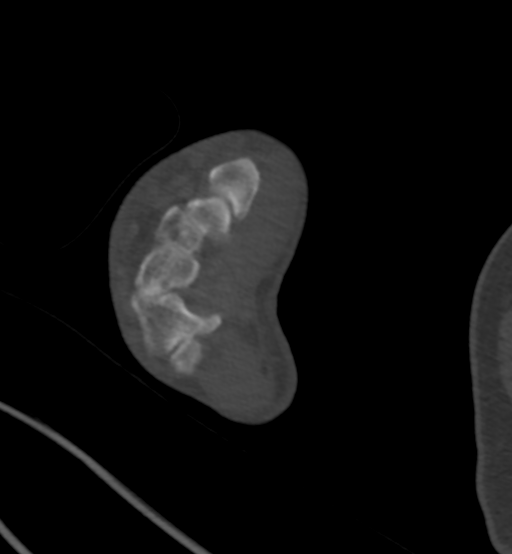
[im 153/495  bone]
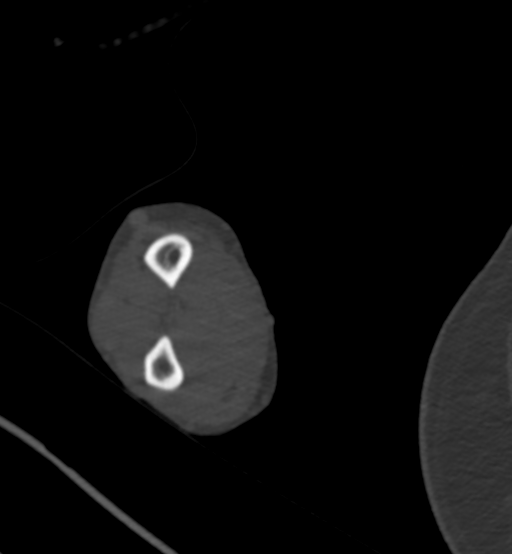
[im 229/495  bone]
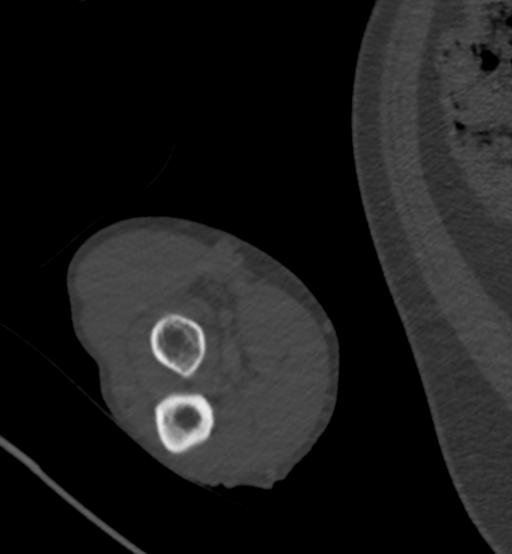
[im 305/495  bone]
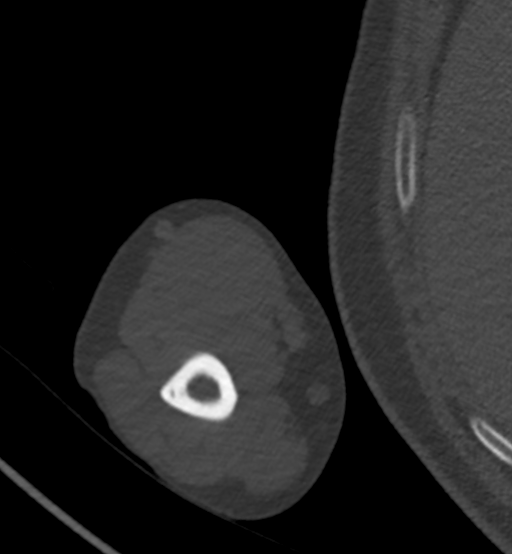
[im 381/495  soft-tissue]
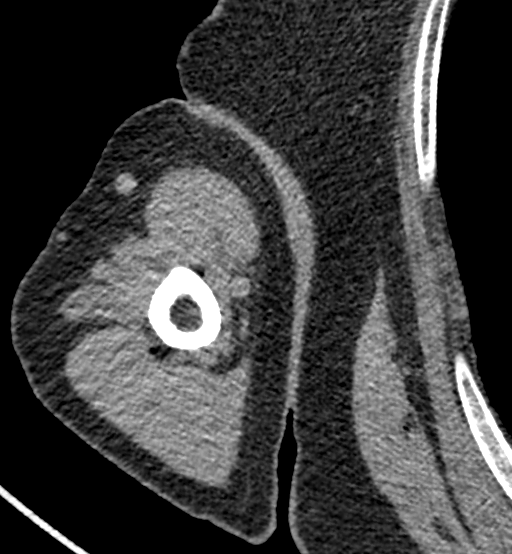
[im 381/495  bone]
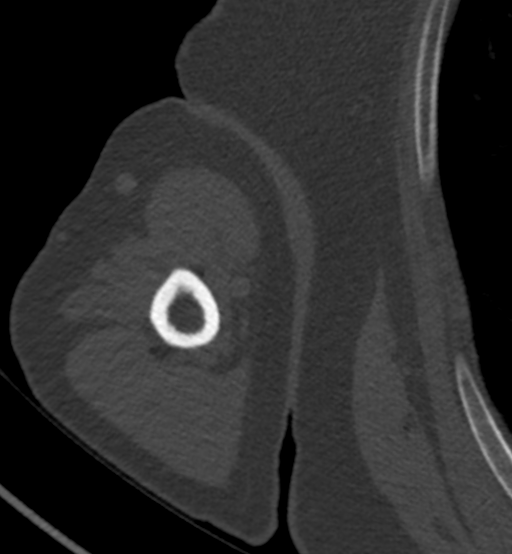
[im 457/495  bone]
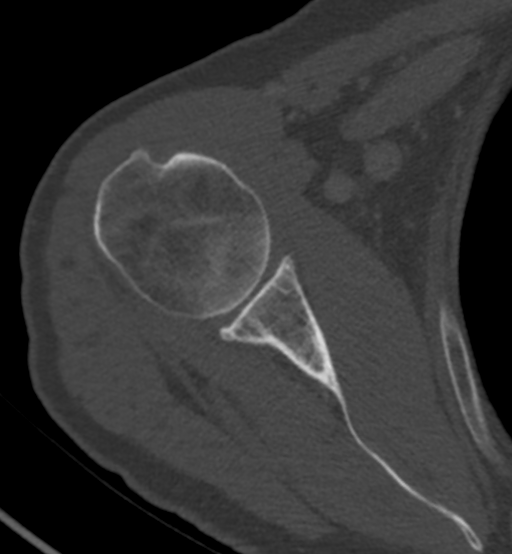

[Series 8: cor st · coronal · 0.36mm/px · 3 of 115 slices shown]
[im 23/115  bone]
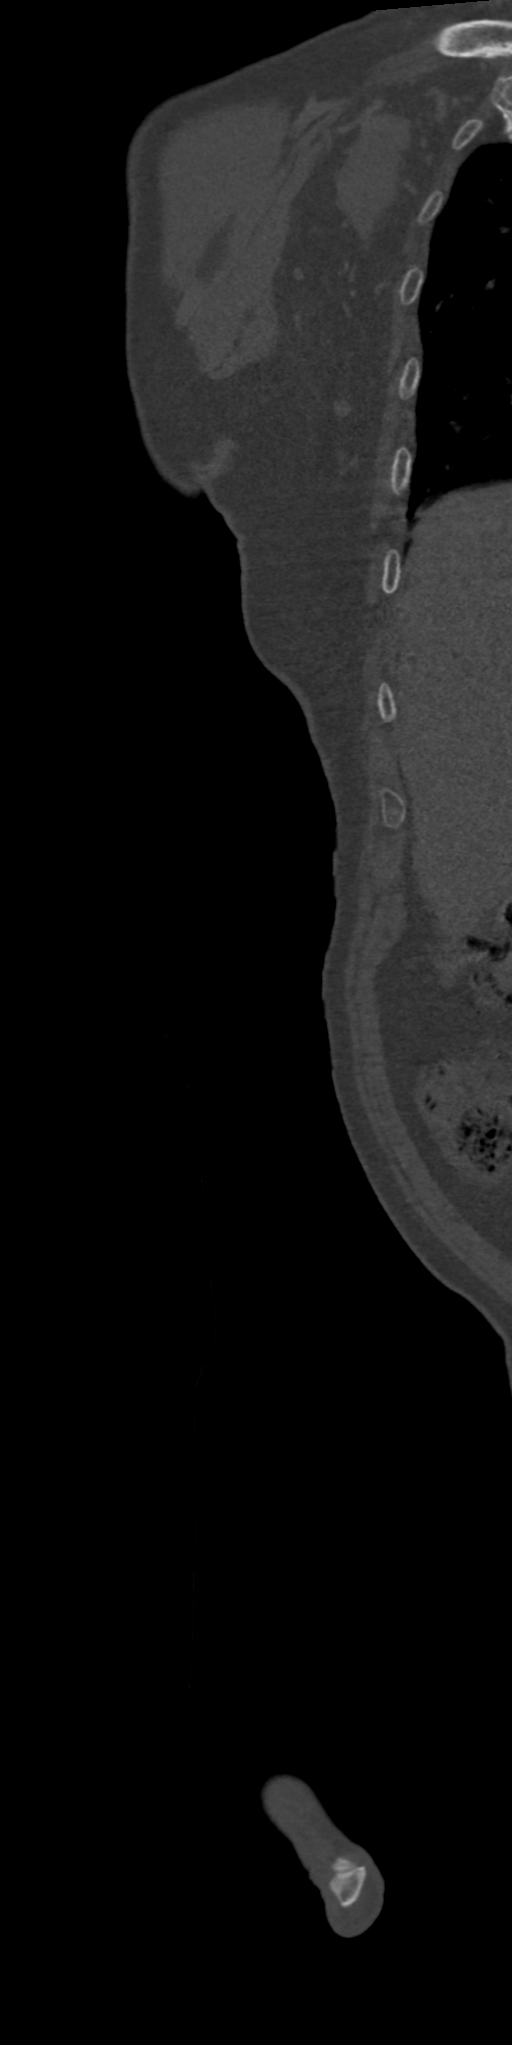
[im 46/115  bone]
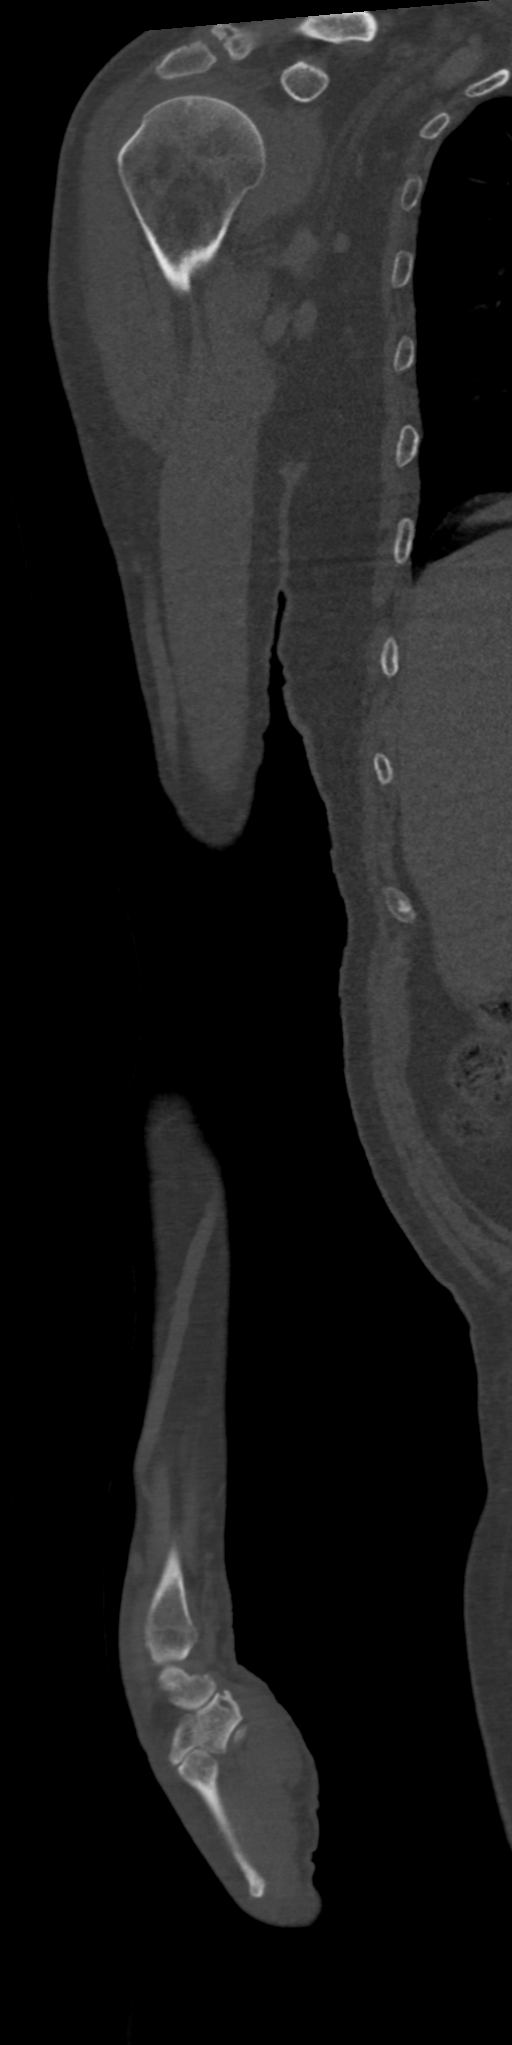
[im 69/115  bone]
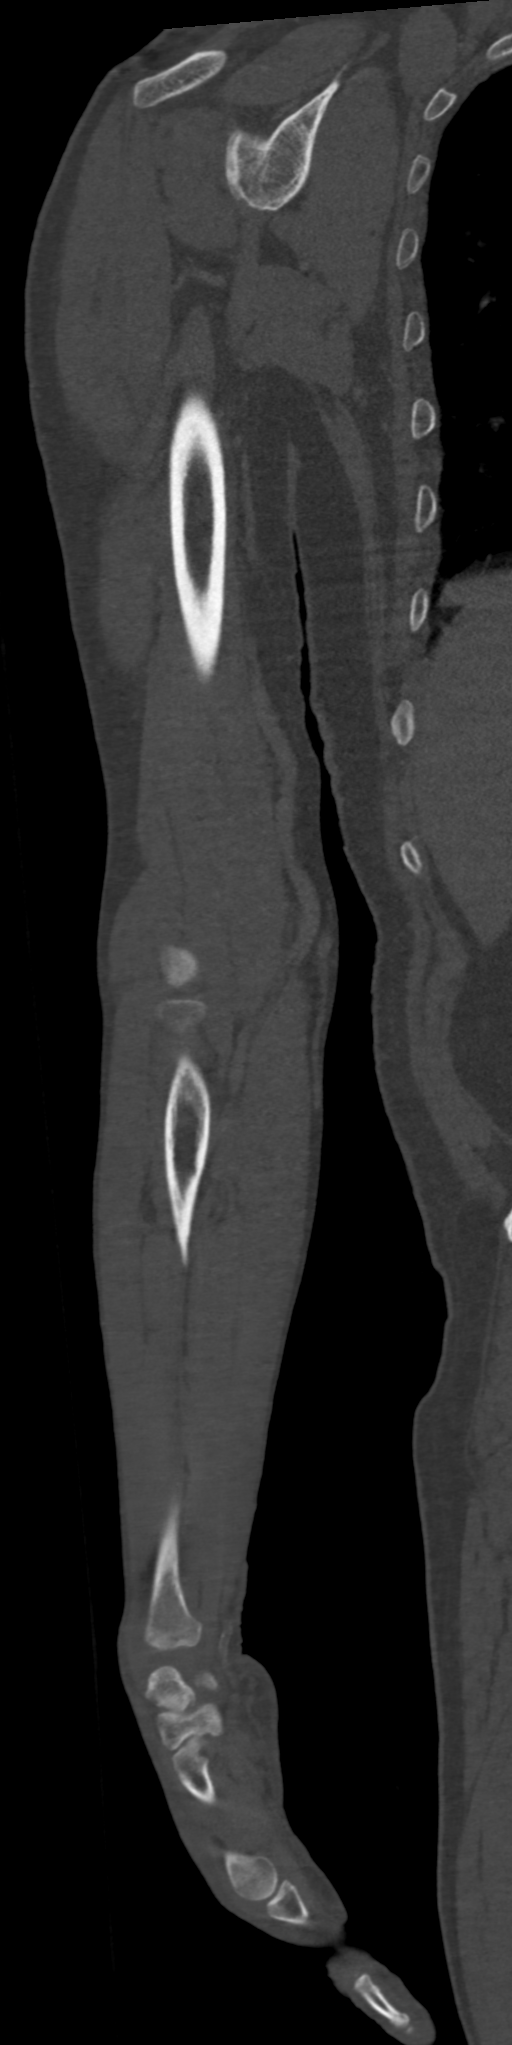

[9 of 35 positions shown; findings below may reference images not displayed]

FINDINGS: Bones/Joint/Cartilage

No acute or destructive bony lesions. Right shoulder, elbow, and
wrist are well aligned. Joint spaces are well preserved.

Ligaments

Suboptimally assessed by CT.

Muscles and Tendons

Unremarkable

Soft tissues

No fluid collection or abscess. Diffuse subcutaneous fat stranding
throughout the dorsum of the hand and wrist. Remaining soft tissues
are unremarkable. Reconstructed images demonstrate no additional
findings.
IMPRESSION: 1. Subcutaneous edema throughout the dorsum of the hand and wrist.
2. No fluid collection or abscess.
3. No acute or destructive bony lesions.

## 2022-02-02 DIAGNOSIS — D472 Monoclonal gammopathy: Secondary | ICD-10-CM | POA: Diagnosis not present

## 2022-03-02 DIAGNOSIS — Z86010 Personal history of colonic polyps: Secondary | ICD-10-CM | POA: Diagnosis not present

## 2022-03-02 DIAGNOSIS — K219 Gastro-esophageal reflux disease without esophagitis: Secondary | ICD-10-CM | POA: Diagnosis not present

## 2022-04-22 DIAGNOSIS — H903 Sensorineural hearing loss, bilateral: Secondary | ICD-10-CM | POA: Diagnosis not present

## 2022-05-08 DIAGNOSIS — J069 Acute upper respiratory infection, unspecified: Secondary | ICD-10-CM | POA: Diagnosis not present

## 2022-05-28 ENCOUNTER — Encounter: Payer: Self-pay | Admitting: Gastroenterology

## 2022-05-28 NOTE — H&P (Signed)
Pre-Procedure H&P   Patient ID: Austin Evans is a 82 y.o. male.  Gastroenterology Provider: [name], DO  Referring Provider: Laurine Blazer, PA PCP: Adin Hector, MD  Date: 05/29/2022  HPI Austin Evans is a 82 y.o. male who presents today for Colonoscopy for Surveillance-personal history of colon polyps .  Patient currently with daily bowel meds without melena hematochezia diarrhea or constipation.  Weight and appetite have been stable.  No family history of colon cancer or colon polyps.  Most recent colonoscopy in January 2020 demonstrating 7 tubular adenomatous polyps.  He was also noted to have polyps on colonoscopy in October 2014. Has a history of chronic anemia.  Also noted to have MGUS  Most recent hemoglobin 11.2 MCV 93 platelets 176,000 creatinine 0.8  Previous tobacco use   Past Medical History:  Diagnosis Date   Allergic genetic state    Anemia    Erectile dysfunction    Full dentures    upper and lower   GERD (gastroesophageal reflux disease)    Hearing aid worn    Hypertension    Hypertensive cardiovascular disease    MGUS (monoclonal gammopathy of unknown significance)    Umbilical hernia     Past Surgical History:  Procedure Laterality Date   COLONOSCOPY     COLONOSCOPY WITH PROPOFOL N/A 06/10/2018   Procedure: COLONOSCOPY WITH PROPOFOL;  Surgeon: Manya Silvas, MD;  Location: [facility] ENDOSCOPY;  Service: Endoscopy;  Laterality: N/A;   ESOPHAGOGASTRODUODENOSCOPY N/A 11/30/2014   Procedure: ESOPHAGOGASTRODUODENOSCOPY (EGD) with dialtion;  Surgeon: Lucilla Lame, MD;  Location: [place];  Service: Gastroenterology;  Laterality: N/A;   EYE SURGERY Bilateral    cataracts   FOREIGN BODY REMOVAL N/A 10/08/2014   Procedure: upper endoscopy with FOREIGN BODY REMOVAL;  Surgeon: Lucilla Lame, MD;  Location: [facility] ENDOSCOPY;  Service: Endoscopy;  Laterality: N/A;   HERNIA REPAIR      Family History No h/o GI disease or  malignancy  Review of Systems  Constitutional:  Negative for activity change, appetite change, chills, diaphoresis, fatigue, fever and unexpected weight change.  HENT:  Negative for trouble swallowing and voice change.   Respiratory:  Negative for shortness of breath and wheezing.   Cardiovascular:  Negative for chest pain, palpitations and leg swelling.  Gastrointestinal:  Negative for abdominal distention, abdominal pain, anal bleeding, blood in stool, constipation, diarrhea, nausea and vomiting.  Musculoskeletal:  Negative for arthralgias and myalgias.  Skin:  Negative for color change and pallor.  Neurological:  Negative for dizziness, syncope and weakness.  Psychiatric/Behavioral:  Negative for confusion. The patient is not nervous/anxious.   All other systems reviewed and are negative.    Medications No current facility-administered medications on file prior to encounter.   Current Outpatient Medications on File Prior to Encounter  Medication Sig Dispense Refill   allopurinol (ZYLOPRIM) 100 MG tablet Take 100 mg by mouth daily.     amLODipine (NORVASC) 5 MG tablet Take 5 mg by mouth daily.     benazepril (LOTENSIN) 40 MG tablet Take 40 mg by mouth daily.     Calcium Carbonate-Vitamin D 600-400 MG-UNIT tablet Take 1 tablet by mouth daily.     colchicine 0.6 MG tablet Take 0.6 mg by mouth 2 (two) times daily.     felodipine (PLENDIL) 5 MG 24 hr tablet Take 5 mg by mouth daily.     fluticasone (FLONASE) 50 MCG/ACT nasal spray Place 2 sprays into both nostrils daily. AM  hydrochlorothiazide (HYDRODIURIL) 25 MG tablet Take 25 mg by mouth daily.     levocetirizine (XYZAL) 5 MG tablet Take 5 mg by mouth every evening.     meloxicam (MOBIC) 15 MG tablet Take 15 mg by mouth daily.     pantoprazole (PROTONIX) 40 MG tablet Take 40 mg by mouth daily.      Pertinent medications related to GI and procedure were reviewed by me with the patient prior to the procedure   Current  Facility-Administered Medications:    0.9 %  sodium chloride infusion, , Intravenous, Continuous, [name], DO, Last Rate: 20 mL/hr at 05/29/22 1041, 20 mL/hr at 05/29/22 1041      No Known Allergies Allergies were reviewed by me prior to the procedure  Objective   Body mass index is 30.21 kg/m. Vitals:   05/29/22 1025  BP: 138/83  Pulse: 71  Resp: 20  Temp: (!) 96.3 F (35.7 C)  TempSrc: Temporal  SpO2: 100%  Weight: 84.9 kg  Height: '5\' 6"'$  (1.676 m)     Physical Exam Vitals and nursing note reviewed.  Constitutional:      General: He is not in acute distress.    Appearance: Normal appearance. He is not ill-appearing, toxic-appearing or diaphoretic.  HENT:     Head: Normocephalic and atraumatic.     Nose: Nose normal.     Mouth/Throat:     Mouth: Mucous membranes are moist.     Pharynx: Oropharynx is clear.  Eyes:     General: No scleral icterus.    Extraocular Movements: Extraocular movements intact.  Cardiovascular:     Rate and Rhythm: Normal rate and regular rhythm.     Heart sounds: Normal heart sounds. No murmur heard.    No friction rub. No gallop.  Pulmonary:     Effort: Pulmonary effort is normal. No respiratory distress.     Breath sounds: Normal breath sounds. No wheezing, rhonchi or rales.  Abdominal:     General: Bowel sounds are normal. There is no distension.     Palpations: Abdomen is soft.     Tenderness: There is no abdominal tenderness. There is no guarding or rebound.  Musculoskeletal:     Cervical back: Neck supple.     Right lower leg: No edema.     Left lower leg: No edema.  Skin:    General: Skin is warm and dry.     Coloration: Skin is not jaundiced or pale.  Neurological:     General: No focal deficit present.     Mental Status: He is alert and oriented to person, place, and time. Mental status is at baseline.  Psychiatric:        Mood and Affect: Mood normal.        Behavior: Behavior normal.        Thought  Content: Thought content normal.        Judgment: Judgment normal.      Assessment:  Austin Evans is a 82 y.o. male  who presents today for Colonoscopy for Surveillance-personal history of colon polyps .  Plan:  Colonoscopy with possible intervention today  Colonoscopy with possible biopsy, control of bleeding, polypectomy, and interventions as necessary has been discussed with the patient/patient representative. Informed consent was obtained from the patient/patient representative after explaining the indication, nature, and risks of the procedure including but not limited to death, bleeding, perforation, missed neoplasm/lesions, cardiorespiratory compromise, and reaction to medications. Opportunity for questions was given and  appropriate answers were provided. Patient/patient representative has verbalized understanding is amenable to undergoing the procedure.   [name], DO  [facility]  Portions of the record may have been created with voice recognition software. Occasional wrong-word or 'sound-a-like' substitutions may have occurred due to the inherent limitations of voice recognition software.  Read the chart carefully and recognize, using context, where substitutions may have occurred.

## 2022-05-29 ENCOUNTER — Ambulatory Visit
Admission: RE | Admit: 2022-05-29 | Discharge: 2022-05-29 | Disposition: A | Discharge status: Home / Self Care | Payer: Medicare HMO | Attending: Gastroenterology | Admitting: Gastroenterology

## 2022-05-29 ENCOUNTER — Ambulatory Visit: Payer: Medicare HMO | Admitting: Anesthesiology

## 2022-05-29 ENCOUNTER — Encounter: Payer: Self-pay | Admitting: Gastroenterology

## 2022-05-29 ENCOUNTER — Encounter
Admission: RE | Disposition: A | Discharge status: Home / Self Care | Payer: Self-pay | Source: Home / Self Care | Attending: Gastroenterology

## 2022-05-29 DIAGNOSIS — K219 Gastro-esophageal reflux disease without esophagitis: Secondary | ICD-10-CM | POA: Diagnosis not present

## 2022-05-29 DIAGNOSIS — K640 First degree hemorrhoids: Secondary | ICD-10-CM | POA: Insufficient documentation

## 2022-05-29 DIAGNOSIS — Z87891 Personal history of nicotine dependence: Secondary | ICD-10-CM | POA: Diagnosis not present

## 2022-05-29 DIAGNOSIS — I10 Essential (primary) hypertension: Secondary | ICD-10-CM | POA: Diagnosis not present

## 2022-05-29 DIAGNOSIS — D472 Monoclonal gammopathy: Secondary | ICD-10-CM | POA: Insufficient documentation

## 2022-05-29 DIAGNOSIS — K5730 Diverticulosis of large intestine without perforation or abscess without bleeding: Secondary | ICD-10-CM | POA: Insufficient documentation

## 2022-05-29 DIAGNOSIS — D124 Benign neoplasm of descending colon: Secondary | ICD-10-CM | POA: Diagnosis not present

## 2022-05-29 DIAGNOSIS — Z86010 Personal history of colonic polyps: Secondary | ICD-10-CM | POA: Insufficient documentation

## 2022-05-29 DIAGNOSIS — D175 Benign lipomatous neoplasm of intra-abdominal organs: Secondary | ICD-10-CM | POA: Diagnosis not present

## 2022-05-29 DIAGNOSIS — K621 Rectal polyp: Secondary | ICD-10-CM | POA: Diagnosis not present

## 2022-05-29 DIAGNOSIS — Z1211 Encounter for screening for malignant neoplasm of colon: Secondary | ICD-10-CM | POA: Diagnosis not present

## 2022-05-29 DIAGNOSIS — D125 Benign neoplasm of sigmoid colon: Secondary | ICD-10-CM | POA: Insufficient documentation

## 2022-05-29 DIAGNOSIS — I119 Hypertensive heart disease without heart failure: Secondary | ICD-10-CM | POA: Diagnosis not present

## 2022-05-29 DIAGNOSIS — E669 Obesity, unspecified: Secondary | ICD-10-CM | POA: Diagnosis not present

## 2022-05-29 DIAGNOSIS — D122 Benign neoplasm of ascending colon: Secondary | ICD-10-CM | POA: Diagnosis not present

## 2022-05-29 DIAGNOSIS — K635 Polyp of colon: Secondary | ICD-10-CM | POA: Diagnosis not present

## 2022-05-29 DIAGNOSIS — Z6830 Body mass index (BMI) 30.0-30.9, adult: Secondary | ICD-10-CM | POA: Diagnosis not present

## 2022-05-29 HISTORY — PX: COLONOSCOPY WITH PROPOFOL: SHX5780

## 2022-05-29 SURGERY — COLONOSCOPY WITH PROPOFOL
Anesthesia: General

## 2022-05-29 MED ORDER — PROPOFOL 10 MG/ML IV BOLUS
INTRAVENOUS | Status: DC | PRN
  Administered 2022-05-29: 100 mg via INTRAVENOUS
  Administered 2022-05-29: 200 ug/kg/min via INTRAVENOUS
  Administered 2022-05-29: 30 mg via INTRAVENOUS

## 2022-05-29 MED ORDER — LIDOCAINE HCL (CARDIAC) PF 100 MG/5ML IV SOSY
PREFILLED_SYRINGE | INTRAVENOUS | Status: DC | PRN
  Administered 2022-05-29: 60 mg via INTRAVENOUS

## 2022-05-29 MED ORDER — SODIUM CHLORIDE 0.9 % IV SOLN
INTRAVENOUS | Status: DC
  Administered 2022-05-29: 20 mL/h via INTRAVENOUS

## 2022-05-29 NOTE — Anesthesia Postprocedure Evaluation (Signed)
Anesthesia Post Note  Patient: Austin Evans  Procedure(s) Performed: COLONOSCOPY WITH PROPOFOL  Patient location during evaluation: Endoscopy Anesthesia Type: General Level of consciousness: awake and alert Pain management: pain level controlled Vital Signs Assessment: post-procedure vital signs reviewed and stable Respiratory status: spontaneous breathing, nonlabored ventilation, respiratory function stable and patient connected to nasal cannula oxygen Cardiovascular status: blood pressure returned to baseline and stable Postop Assessment: no apparent nausea or vomiting Anesthetic complications: no   There were no known notable events for this encounter.   Last Vitals:  Vitals:   05/29/22 1159 05/29/22 1209  BP: 102/61 (!) 128/58  Pulse: (!) 54 (!) 55  Resp: 17 15  Temp:    SpO2: 97% 99%    Last Pain:  Vitals:   05/29/22 1209  TempSrc:   PainSc: 0-No pain                 Precious Haws Davonna Ertl

## 2022-05-29 NOTE — Op Note (Signed)
[facility] Gastroenterology Patient Name: Austin Evans Procedure Date: 05/29/2022 10:49 AM MRN: [id number] Account #: [id number] Date of Birth: [birth year] Admit Type: Outpatient Age: 82 Room: [facility] ENDO ROOM 1 Gender: Male Note Status: [place] Instrument Name: Colonoscope [id number] Procedure:             Colonoscopy Indications:           High risk colon cancer surveillance: Personal history                         of colonic polyps Providers:             Rueben Bash, DO Referring MD:          [name] DO, DO (Referring MD), Ramonita Lab, MD (Referring MD) Medicines:             Monitored Anesthesia Care Complications:         No immediate complications. Estimated blood loss:                         Minimal. Procedure:             Pre-Anesthesia Assessment:                        - Prior to the procedure, a History and Physical was                         performed, and patient medications and allergies were                         reviewed. The patient is competent. The risks and                         benefits of the procedure and the sedation options and                         risks were discussed with the patient. All questions                         were answered and informed consent was obtained.                         Patient identification and proposed procedure were                         verified by the physician, the nurse, the anesthetist                         and the technician in the endoscopy suite. Mental                         Status Examination: alert and oriented. Airway                         Examination: normal oropharyngeal airway and neck  mobility. Respiratory Examination: clear to                         auscultation. CV Examination: RRR, no murmurs, no S3                         or S4. Prophylactic Antibiotics: The patient does not                         require  prophylactic antibiotics. Prior                         Anticoagulants: The patient has taken no anticoagulant                         or antiplatelet agents. ASA Grade Assessment: III - A                         patient with severe systemic disease. After reviewing                         the risks and benefits, the patient was deemed in                         satisfactory condition to undergo the procedure. The                         anesthesia plan was to use monitored anesthesia care                         (MAC). Immediately prior to administration of                         medications, the patient was re-assessed for adequacy                         to receive sedatives. The heart rate, respiratory                         rate, oxygen saturations, blood pressure, adequacy of                         pulmonary ventilation, and response to care were                         monitored throughout the procedure. The physical                         status of the patient was re-assessed after the                         procedure.                        After obtaining informed consent, the colonoscope was                         passed under direct vision. Throughout the procedure,  the patient's blood pressure, pulse, and oxygen                         saturations were monitored continuously. The                         Colonoscope was introduced through the anus and                         advanced to the the terminal ileum, with                         identification of the appendiceal orifice and IC                         valve. The colonoscopy was performed without                         difficulty. The patient tolerated the procedure well.                         The quality of the bowel preparation was evaluated                         using the BBPS [facility] Bowel Preparation Scale) with                         scores of: Right Colon = 3, Transverse Colon =  3 and                         Left Colon = 3 (entire mucosa seen well with no                         residual staining, small fragments of stool or opaque                         liquid). The total BBPS score equals 9. The terminal                         ileum, ileocecal valve, appendiceal orifice, and                         rectum were photographed. Findings:      The perianal and digital rectal examinations were normal. Pertinent       negatives include normal sphincter tone.      The terminal ileum appeared normal. Estimated blood loss: none.      A few small-mouthed diverticula were found in the recto-sigmoid colon       and sigmoid colon. Estimated blood loss: none.      Non-bleeding internal hemorrhoids were found during retroflexion. The       hemorrhoids were Grade I (internal hemorrhoids that do not prolapse).       Estimated blood loss: none.      There was a large lipoma, in the recto-sigmoid colon and at 15 cm       proximal to the anus. Estimated blood loss: none.      Two sessile polyps were found in the sigmoid colon and  ascending colon.       The polyps were 3 to 4 mm in size. These polyps were removed with a cold       snare. Resection and retrieval were complete. Estimated blood loss was       minimal.      Four sessile polyps were found in the rectum (1), sigmoid colon (1),       descending colon (1) and transverse colon (1). The polyps were 1 to 2 mm       in size. These polyps were removed with a jumbo cold forceps. Resection       and retrieval were complete. Estimated blood loss was minimal.      The exam was otherwise without abnormality on direct and retroflexion       views. Impression:            - The examined portion of the ileum was normal.                        - Diverticulosis in the recto-sigmoid colon and in the                         sigmoid colon.                        - Non-bleeding internal hemorrhoids.                        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                        - Two 3 to 4 mm polyps in the sigmoid colon and in the                         ascending colon, removed with a cold snare. Resected                         and retrieved.                        - Four 1 to 2 mm polyps in the rectum, in the sigmoid                         colon, in the descending colon and in the transverse                         colon, removed with a jumbo cold forceps. Resected and                         retrieved.                        - The examination was otherwise normal on direct and                         retroflexion views. Recommendation:        - Patient has a  contact number available for                         emergencies. The signs and symptoms of potential                         delayed complications were discussed with the patient.                         Return to normal activities tomorrow. Written                         discharge instructions were provided to the patient.                        - Discharge patient to home.                        - Resume previous diet.                        - Continue present medications.                        - Await pathology results.                        - Repeat colonoscopy for surveillance based on                         pathology results.                        - No ibuprofen, naproxen, or other non-steroidal                         anti-inflammatory drugs for 5 days after polyp removal.                        - Return to referring physician as previously                         scheduled.                        - The findings and recommendations were discussed with                         the patient. Procedure Code(s):     --- Professional ---                        [id number], Colonoscopy, flexible; with removal of                         tumor(s), polyp(s), or other lesion(s) by snare                          technique                        96759, 59, Colonoscopy, flexible; with biopsy, single  or multiple Diagnosis Code(s):     --- Professional ---                        Z86.010, Personal history of colonic polyps                        K64.0, First degree hemorrhoids                        D17.5, Benign lipomatous neoplasm of intra-abdominal                         organs                        D12.2, Benign neoplasm of ascending colon                        D12.8, Benign neoplasm of rectum                        D12.5, Benign neoplasm of sigmoid colon                        D12.4, Benign neoplasm of descending colon                        D12.3, Benign neoplasm of transverse colon (hepatic                         flexure or splenic flexure)                        K57.30, Diverticulosis of large intestine without                         perforation or abscess without bleeding CPT copyright 2022 American Medical Association. All rights reserved. The codes documented in this report are preliminary and upon coder review may  be revised to meet current compliance requirements. Attending Participation:      I personally performed the entire procedure. Volney American, DO [name] DO, DO 05/29/2022 11:49:29 AM This report has been signed electronically. Number of Addenda: 0 Note Initiated On: 05/29/2022 10:49 AM Scope Withdrawal Time: 0 hours 22 minutes 32 seconds  Total Procedure Duration: 0 hours 26 minutes 13 seconds  Estimated Blood Loss:  Estimated blood loss was minimal.      [facility]

## 2022-05-29 NOTE — Interval H&P Note (Signed)
History and Physical Interval Note: Preprocedure H&P from 05/29/22  was reviewed and there was no interval change after seeing and examining the patient.  Written consent was obtained from the patient after discussion of risks, benefits, and alternatives. Patient has consented to proceed with Colonoscopy with possible intervention   05/29/2022 11:09 AM  Austin Evans  has presented today for surgery, with the diagnosis of Z86.010 - History of colon polyps.  The various methods of treatment have been discussed with the patient and family. After consideration of risks, benefits and other options for treatment, the patient has consented to  Procedure(s): COLONOSCOPY WITH PROPOFOL (N/A) as a surgical intervention.  The patient's history has been reviewed, patient examined, no change in status, stable for surgery.  I have reviewed the patient's chart and labs.  Questions were answered to the patient's satisfaction.     [name]

## 2022-05-29 NOTE — Anesthesia Preprocedure Evaluation (Signed)
Anesthesia Evaluation  Patient identified by MRN, date of birth, ID band Patient awake    Reviewed: Allergy & Precautions, NPO status , Patient's Chart, lab work & pertinent test results  History of Anesthesia Complications Negative for: history of anesthetic complications  Airway Mallampati: III  TM Distance: <3 FB Neck ROM: full    Dental  (+) Upper Dentures, Lower Dentures   Pulmonary neg shortness of breath, former smoker   Pulmonary exam normal        Cardiovascular Exercise Tolerance: Good hypertension, (-) angina Normal cardiovascular exam     Neuro/Psych negative neurological ROS  negative psych ROS   GI/Hepatic Neg liver ROS,GERD  Controlled,,  Endo/Other  negative endocrine ROS    Renal/GU negative Renal ROS  negative genitourinary   Musculoskeletal   Abdominal   Peds  Hematology negative hematology ROS (+)   Anesthesia Other Findings Past Medical History: No date: Allergic genetic state No date: Anemia No date: Erectile dysfunction No date: Full dentures     Comment:  upper and lower No date: GERD (gastroesophageal reflux disease) No date: Hearing aid worn No date: Hypertension No date: Hypertensive cardiovascular disease 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) No date: Umbilical hernia  Past Surgical History: No date: COLONOSCOPY 06/10/2018: COLONOSCOPY WITH PROPOFOL; N/A     Comment:  Procedure: COLONOSCOPY WITH PROPOFOL;  Surgeon: Manya Silvas, MD;  Location: [facility] ENDOSCOPY;  Service:               Endoscopy;  Laterality: N/A; 11/30/2014: ESOPHAGOGASTRODUODENOSCOPY; N/A     Comment:  Procedure: ESOPHAGOGASTRODUODENOSCOPY (EGD) with               dialtion;  Surgeon: Lucilla Lame, MD;  Location: [place];  Service: Gastroenterology;  Laterality:               N/A; No date: EYE SURGERY; Bilateral     Comment:   cataracts 10/08/2014: FOREIGN BODY REMOVAL; N/A     Comment:  Procedure: upper endoscopy with FOREIGN BODY REMOVAL;                Surgeon: Lucilla Lame, MD;  Location: [facility] ENDOSCOPY;                Service: Endoscopy;  Laterality: N/A; No date: HERNIA REPAIR     Reproductive/Obstetrics negative OB ROS                             Anesthesia Physical Anesthesia Plan  ASA: 3  Anesthesia Plan: General   Post-op Pain Management:    Induction: Intravenous  PONV Risk Score and Plan: Propofol infusion and TIVA  Airway Management Planned: Natural Airway and Nasal Cannula  Additional Equipment:   Intra-op Plan:   Post-operative Plan:   Informed Consent: I have reviewed the patients History and Physical, chart, labs and discussed the procedure including the risks, benefits and alternatives for the proposed anesthesia with the patient or authorized representative who has indicated his/her understanding and acceptance.     Dental Advisory Given  Plan Discussed with: Anesthesiologist, CRNA and Surgeon  Anesthesia Plan Comments: (Patient consented for risks of anesthesia including but not limited to:  - adverse reactions to medications - risk of airway placement if required - damage to  eyes, teeth, lips or other oral mucosa - nerve damage due to positioning  - sore throat or hoarseness - Damage to heart, brain, nerves, lungs, other parts of body or loss of life  Patient voiced understanding.)       Anesthesia Quick Evaluation

## 2022-05-29 NOTE — Transfer of Care (Signed)
Immediate Anesthesia Transfer of Care Note  Patient: Austin Evans  Procedure(s) Performed: COLONOSCOPY WITH PROPOFOL  Patient Location: PACU  Anesthesia Type:General  Level of Consciousness: drowsy  Airway & Oxygen Therapy: Patient Spontanous Breathing  Post-op Assessment: Report given to RN and Post -op Vital signs reviewed and stable  Post vital signs: Reviewed and stable  Last Vitals:  Vitals Value Taken Time  BP 85/55 05/29/22 1148  Temp    Pulse 49 05/29/22 1149  Resp 16 05/29/22 1149  SpO2 98 % 05/29/22 1149  Vitals shown include unvalidated device data.  Last Pain:  Vitals:   05/29/22 1025  TempSrc: Temporal  PainSc: 0-No pain         Complications: No notable events documented.

## 2022-06-01 ENCOUNTER — Encounter: Payer: Self-pay | Admitting: Gastroenterology

## 2022-06-01 LAB — SURGICAL PATHOLOGY

## 2022-06-29 DIAGNOSIS — I10 Essential (primary) hypertension: Secondary | ICD-10-CM | POA: Diagnosis not present

## 2022-06-29 DIAGNOSIS — E785 Hyperlipidemia, unspecified: Secondary | ICD-10-CM | POA: Diagnosis not present

## 2022-06-29 DIAGNOSIS — R7303 Prediabetes: Secondary | ICD-10-CM | POA: Diagnosis not present

## 2022-06-29 DIAGNOSIS — D649 Anemia, unspecified: Secondary | ICD-10-CM | POA: Diagnosis not present

## 2022-06-29 DIAGNOSIS — D472 Monoclonal gammopathy: Secondary | ICD-10-CM | POA: Diagnosis not present

## 2022-07-06 DIAGNOSIS — D472 Monoclonal gammopathy: Secondary | ICD-10-CM | POA: Diagnosis not present

## 2022-07-06 DIAGNOSIS — E785 Hyperlipidemia, unspecified: Secondary | ICD-10-CM | POA: Diagnosis not present

## 2022-07-06 DIAGNOSIS — R7303 Prediabetes: Secondary | ICD-10-CM | POA: Diagnosis not present

## 2022-07-06 DIAGNOSIS — D692 Other nonthrombocytopenic purpura: Secondary | ICD-10-CM | POA: Diagnosis not present

## 2022-07-06 DIAGNOSIS — D649 Anemia, unspecified: Secondary | ICD-10-CM | POA: Diagnosis not present

## 2022-07-06 DIAGNOSIS — D72819 Decreased white blood cell count, unspecified: Secondary | ICD-10-CM | POA: Diagnosis not present

## 2022-07-06 DIAGNOSIS — M109 Gout, unspecified: Secondary | ICD-10-CM | POA: Diagnosis not present

## 2022-07-06 DIAGNOSIS — D509 Iron deficiency anemia, unspecified: Secondary | ICD-10-CM | POA: Diagnosis not present

## 2022-07-06 DIAGNOSIS — I10 Essential (primary) hypertension: Secondary | ICD-10-CM | POA: Diagnosis not present

## 2022-07-06 DIAGNOSIS — K219 Gastro-esophageal reflux disease without esophagitis: Secondary | ICD-10-CM | POA: Diagnosis not present

## 2022-08-09 DIAGNOSIS — J029 Acute pharyngitis, unspecified: Secondary | ICD-10-CM | POA: Diagnosis not present

## 2022-08-09 DIAGNOSIS — R059 Cough, unspecified: Secondary | ICD-10-CM | POA: Diagnosis not present

## 2022-12-28 DIAGNOSIS — H40013 Open angle with borderline findings, low risk, bilateral: Secondary | ICD-10-CM | POA: Diagnosis not present

## 2022-12-28 DIAGNOSIS — Z961 Presence of intraocular lens: Secondary | ICD-10-CM | POA: Diagnosis not present

## 2022-12-28 DIAGNOSIS — H26493 Other secondary cataract, bilateral: Secondary | ICD-10-CM | POA: Diagnosis not present

## 2023-01-06 DIAGNOSIS — M1A00X0 Idiopathic chronic gout, unspecified site, without tophus (tophi): Secondary | ICD-10-CM | POA: Diagnosis not present

## 2023-01-06 DIAGNOSIS — D649 Anemia, unspecified: Secondary | ICD-10-CM | POA: Diagnosis not present

## 2023-01-06 DIAGNOSIS — E785 Hyperlipidemia, unspecified: Secondary | ICD-10-CM | POA: Diagnosis not present

## 2023-01-06 DIAGNOSIS — D72819 Decreased white blood cell count, unspecified: Secondary | ICD-10-CM | POA: Diagnosis not present

## 2023-01-06 DIAGNOSIS — R7303 Prediabetes: Secondary | ICD-10-CM | POA: Diagnosis not present

## 2023-01-13 DIAGNOSIS — M109 Gout, unspecified: Secondary | ICD-10-CM | POA: Diagnosis not present

## 2023-01-13 DIAGNOSIS — E785 Hyperlipidemia, unspecified: Secondary | ICD-10-CM | POA: Diagnosis not present

## 2023-01-13 DIAGNOSIS — D61818 Other pancytopenia: Secondary | ICD-10-CM | POA: Diagnosis not present

## 2023-01-13 DIAGNOSIS — Z1331 Encounter for screening for depression: Secondary | ICD-10-CM | POA: Diagnosis not present

## 2023-01-13 DIAGNOSIS — K219 Gastro-esophageal reflux disease without esophagitis: Secondary | ICD-10-CM | POA: Diagnosis not present

## 2023-01-13 DIAGNOSIS — D472 Monoclonal gammopathy: Secondary | ICD-10-CM | POA: Diagnosis not present

## 2023-01-13 DIAGNOSIS — Z0000 Encounter for general adult medical examination without abnormal findings: Secondary | ICD-10-CM | POA: Diagnosis not present

## 2023-01-13 DIAGNOSIS — I10 Essential (primary) hypertension: Secondary | ICD-10-CM | POA: Diagnosis not present

## 2023-01-13 DIAGNOSIS — D692 Other nonthrombocytopenic purpura: Secondary | ICD-10-CM | POA: Diagnosis not present

## 2023-01-13 DIAGNOSIS — R7303 Prediabetes: Secondary | ICD-10-CM | POA: Diagnosis not present

## 2023-03-31 DIAGNOSIS — D649 Anemia, unspecified: Secondary | ICD-10-CM | POA: Diagnosis not present

## 2023-04-07 DIAGNOSIS — D472 Monoclonal gammopathy: Secondary | ICD-10-CM | POA: Diagnosis not present

## 2023-04-07 DIAGNOSIS — D649 Anemia, unspecified: Secondary | ICD-10-CM | POA: Diagnosis not present

## 2023-04-15 DIAGNOSIS — J019 Acute sinusitis, unspecified: Secondary | ICD-10-CM | POA: Diagnosis not present

## 2023-04-26 DIAGNOSIS — D472 Monoclonal gammopathy: Secondary | ICD-10-CM | POA: Diagnosis not present

## 2023-04-26 DIAGNOSIS — D649 Anemia, unspecified: Secondary | ICD-10-CM | POA: Diagnosis not present

## 2023-05-06 ENCOUNTER — Encounter: Payer: Self-pay | Admitting: Dermatology

## 2023-05-06 ENCOUNTER — Ambulatory Visit: Payer: Medicare HMO | Admitting: Dermatology

## 2023-05-06 DIAGNOSIS — W908XXA Exposure to other nonionizing radiation, initial encounter: Secondary | ICD-10-CM | POA: Diagnosis not present

## 2023-05-06 DIAGNOSIS — L249 Irritant contact dermatitis, unspecified cause: Secondary | ICD-10-CM

## 2023-05-06 DIAGNOSIS — Z7189 Other specified counseling: Secondary | ICD-10-CM

## 2023-05-06 DIAGNOSIS — L570 Actinic keratosis: Secondary | ICD-10-CM

## 2023-05-06 MED ORDER — CLOBETASOL PROPIONATE 0.05 % EX CREA: TOPICAL_CREAM | CUTANEOUS | 0 refills | Status: AC

## 2023-05-06 MED ORDER — TRIAMCINOLONE ACETONIDE 0.1 % EX OINT: 1.0000 | TOPICAL_OINTMENT | Freq: Every day | CUTANEOUS | 0 refills | Status: AC | PRN

## 2023-05-06 NOTE — Patient Instructions (Addendum)
Hello Austin Evans,  Thank you for visiting my office today. Your dedication to addressing your dermatological concerns and maintaining your health is greatly appreciated. Here is a summary of the key instructions from today's consultation:  - Actinic Keratosis Treatment:   - Treatment Applied Today: The spots have been treated with liquid nitrogen.   - Post-Treatment Care: Apply Aquaphor Healing Ointment daily to the treated areas. This should continue until they heal, which is expected to be approximately two to three weeks.  - Long Term- Treatment Care:   - Fluorouracil Cream (sample provided): Starting in January, apply fluorouracil cream twice a day on your scalp, forehead, and temples. This is to target any remaining precancerous cells and should continue for two weeks.   - Triamcinolone Cream: Follow the fluorouracil treatment with triamcinolone 0.1% cream, applying it twice a day for two weeks to aid in healing.  - Contact Dermatitis Management:   - Clobetasol Cream: Apply clobetasol cream twice a day for two weeks to the affected area under your neck. If symptoms persist, please return for a possible biopsy.  We have provided written materials for your reference to further understand the treatments. Please ensure to follow these instructions carefully. Do not hesitate to contact us if you have any questions or concerns.  Looking forward to our next visit to ensure everything is progressing well. Take care and stay healthy!  Best regards,  Dr. Langston Reusing,  Dermatologist     Cryotherapy Aftercare  Wash gently with soap and water everyday.   Apply Vaseline and Band-Aid daily until healed.     Important Information  Due to recent changes in healthcare laws, you may see results of your pathology and/or laboratory studies on MyChart before the doctors have had a chance to review them. We understand that in some cases there may be results that are confusing or concerning to you.  Please understand that not all results are received at the same time and often the doctors may need to interpret multiple results in order to provide you with the best plan of care or course of treatment. Therefore, we ask that you please give [place] 2 business days to thoroughly review all your results before contacting the office for clarification. Should we see a critical lab result, you will be contacted sooner.   If You Need Anything After Your Visit  If you have any questions or concerns for your doctor, please call our main line at [phone] If no one answers, please leave a voicemail as directed and we will return your call as soon as possible. Messages left after 4 pm will be answered the following business day.   You may also send [place] a message via MyChart. We typically respond to MyChart messages within 1-2 business days.  For prescription refills, please ask your pharmacy to contact our office. Our fax number is [phone].  If you have an urgent issue when the clinic is closed that cannot wait until the next business day, you can page your doctor at the number below.    Please note that while we do our best to be available for urgent issues outside of office hours, we are not available 24/7.   If you have an urgent issue and are unable to reach [place], you may choose to seek medical care at your doctor's office, retail clinic, urgent care center, or emergency room.  If you have a medical emergency, please immediately call 911 or go to the emergency department. In  the event of inclement weather, please call our main line at [phone] for an update on the status of any delays or closures.  Dermatology Medication Tips: Please keep the boxes that topical medications come in in order to help keep track of the instructions about where and how to use these. Pharmacies typically print the medication instructions only on the boxes and not directly on the medication tubes.   If your  medication is too expensive, please contact our office at [phone] or send [place] a message through MyChart.   We are unable to tell what your co-pay for medications will be in advance as this is different depending on your insurance coverage. However, we may be able to find a substitute medication at lower cost or fill out paperwork to get insurance to cover a needed medication.   If a prior authorization is required to get your medication covered by your insurance company, please allow [place] 1-2 business days to complete this process.  Drug prices often vary depending on where the prescription is filled and some pharmacies may offer cheaper prices.  The website www.goodrx.com contains coupons for medications through different pharmacies. The prices here do not account for what the cost may be with help from insurance (it may be cheaper with your insurance), but the website can give you the price if you did not use any insurance.  - You can print the associated coupon and take it with your prescription to the pharmacy.  - You may also stop by our office during regular business hours and pick up a GoodRx coupon card.  - If you need your prescription sent electronically to a different pharmacy, notify our office through [facility] or by phone at [phone]

## 2023-05-06 NOTE — Progress Notes (Signed)
New Patient Visit   Subjective  Austin Evans is a 82 y.o. male who presents for the following: New Pt - Spot Check  Patient states he  has spots located at the scalp & left wrist that he  would like to have examined. Patient reports the areas have been there for a couple years. He reports the areas are bothersome.Patient rates irritation 2 out of 10. He states that the areas have not spread. Patient reports he  has not previously been treated for these areas. Patient denies Hx of bx. Patient denies family history of skin cancer(s).  The patient has spots, moles and lesions to be evaluated, some may be new or changing and the patient may have concern these could be cancer.   The following portions of the chart were reviewed this encounter and updated as appropriate: medications, allergies, medical history  Review of Systems:  No other skin or systemic complaints except as noted in HPI or Assessment and Plan.  Objective  Well appearing patient in no apparent distress; mood and affect are within normal limits.   A focused examination was performed of the following areas: scalp and left wrist   Relevant exam findings are noted in the Assessment and Plan.  Mid Frontal Scalp (6) Erythematous thin papules/macules with gritty scale.   Assessment & Plan   ACTINIC KERATOSIS Exam: Erythematous thin papules/macules with gritty scale at the scalp  Actinic keratoses are precancerous spots that appear secondary to cumulative UV radiation exposure/sun exposure over time. They are chronic with expected duration over 1 year. A portion of actinic keratoses will progress to squamous cell carcinoma of the skin. It is not possible to reliably predict which spots will progress to skin cancer and so treatment is recommended to prevent development of skin cancer.  Recommend daily broad spectrum sunscreen SPF 30+ to sun-exposed areas, reapply every 2 hours as needed.  Recommend staying in the shade or  wearing long sleeves, sun glasses (UVA+UVB protection) and wide brim hats (4-inch brim around the entire circumference of the hat). Call for new or changing lesions.  Treatment Plan: Start 5-fluorouracil cream twice a day for 14 days to affected areas including scalp.  Reviewed course of treatment and expected reaction.  Patient advised to expect inflammation and crusting and advised that erosions are possible.  Patient advised to be diligent with sun protection during and after treatment. Handout with details of how to apply medication and what to expect provided. Counseled to keep medication out of reach of children and pets.  Reviewed course of treatment and expected reaction.  Patient advised to expect inflammation and crusting and advised that erosions are possible.  Patient advised to be diligent with sun protection during and after treatment. Handout with details of how to apply medication and what to expect provided. Counseled to keep medication out of reach of children and pets.   **Triamcinolone 0.1% ointment Rx sent to use for 2 weeks after completing 5-fluorouacil    IRRITANT CONTACT DERMATITIS Exam: Scaly pink papules and/or plaques at left wrist  Treatment Plan: - Clobetasol 0.05% cream Rx sent to use on left wrist where watch band sits    ACTINIC KERATOSIS   IRRITANT CONTACT DERMATITIS, UNSPECIFIED TRIGGER   AK (ACTINIC KERATOSIS) (6) Mid Frontal Scalp (6) Destruction of lesion - Mid Frontal Scalp (6) Complexity: simple   Destruction method: cryotherapy   Informed consent: discussed and consent obtained   Timeout:  patient name, date of birth, surgical site, and  procedure verified Lesion destroyed using liquid nitrogen: Yes   Region frozen until ice ball extended beyond lesion: Yes   Outcome: patient tolerated procedure well with no complications   Post-procedure details: wound care instructions given    No follow-ups on file.    Documentation: I have reviewed  the above documentation for accuracy and completeness, and I agree with the above.   I, Shirron Marcha Solders, CMA, am acting as scribe for [employer], DO.   Langston Reusing, DO

## 2023-06-23 DIAGNOSIS — R1084 Generalized abdominal pain: Secondary | ICD-10-CM | POA: Diagnosis not present

## 2023-06-23 DIAGNOSIS — D692 Other nonthrombocytopenic purpura: Secondary | ICD-10-CM | POA: Diagnosis not present

## 2023-06-23 DIAGNOSIS — I119 Hypertensive heart disease without heart failure: Secondary | ICD-10-CM | POA: Diagnosis not present

## 2023-06-23 DIAGNOSIS — Z2821 Immunization not carried out because of patient refusal: Secondary | ICD-10-CM | POA: Diagnosis not present

## 2023-06-23 DIAGNOSIS — K529 Noninfective gastroenteritis and colitis, unspecified: Secondary | ICD-10-CM | POA: Diagnosis not present

## 2023-06-23 DIAGNOSIS — D61818 Other pancytopenia: Secondary | ICD-10-CM | POA: Diagnosis not present

## 2023-06-24 DIAGNOSIS — K529 Noninfective gastroenteritis and colitis, unspecified: Secondary | ICD-10-CM | POA: Diagnosis not present

## 2023-06-24 DIAGNOSIS — R1084 Generalized abdominal pain: Secondary | ICD-10-CM | POA: Diagnosis not present

## 2023-06-24 DIAGNOSIS — Z2821 Immunization not carried out because of patient refusal: Secondary | ICD-10-CM | POA: Diagnosis not present

## 2023-06-24 DIAGNOSIS — D692 Other nonthrombocytopenic purpura: Secondary | ICD-10-CM | POA: Diagnosis not present

## 2023-06-24 DIAGNOSIS — K5989 Other specified functional intestinal disorders: Secondary | ICD-10-CM | POA: Diagnosis not present

## 2023-06-24 DIAGNOSIS — R197 Diarrhea, unspecified: Secondary | ICD-10-CM | POA: Diagnosis not present

## 2023-06-24 DIAGNOSIS — I119 Hypertensive heart disease without heart failure: Secondary | ICD-10-CM | POA: Diagnosis not present

## 2023-06-24 DIAGNOSIS — D61818 Other pancytopenia: Secondary | ICD-10-CM | POA: Diagnosis not present

## 2023-07-14 DIAGNOSIS — E611 Iron deficiency: Secondary | ICD-10-CM | POA: Diagnosis not present

## 2023-07-14 DIAGNOSIS — D472 Monoclonal gammopathy: Secondary | ICD-10-CM | POA: Diagnosis not present

## 2023-07-14 DIAGNOSIS — D72819 Decreased white blood cell count, unspecified: Secondary | ICD-10-CM | POA: Diagnosis not present

## 2023-07-14 DIAGNOSIS — R7303 Prediabetes: Secondary | ICD-10-CM | POA: Diagnosis not present

## 2023-07-14 DIAGNOSIS — I10 Essential (primary) hypertension: Secondary | ICD-10-CM | POA: Diagnosis not present

## 2023-07-14 DIAGNOSIS — M109 Gout, unspecified: Secondary | ICD-10-CM | POA: Diagnosis not present

## 2023-07-14 DIAGNOSIS — E785 Hyperlipidemia, unspecified: Secondary | ICD-10-CM | POA: Diagnosis not present

## 2023-07-14 DIAGNOSIS — D649 Anemia, unspecified: Secondary | ICD-10-CM | POA: Diagnosis not present

## 2023-07-21 DIAGNOSIS — E538 Deficiency of other specified B group vitamins: Secondary | ICD-10-CM | POA: Diagnosis not present

## 2023-07-21 DIAGNOSIS — D472 Monoclonal gammopathy: Secondary | ICD-10-CM | POA: Diagnosis not present

## 2023-07-21 DIAGNOSIS — D696 Thrombocytopenia, unspecified: Secondary | ICD-10-CM | POA: Diagnosis not present

## 2023-07-21 DIAGNOSIS — E785 Hyperlipidemia, unspecified: Secondary | ICD-10-CM | POA: Diagnosis not present

## 2023-07-21 DIAGNOSIS — E119 Type 2 diabetes mellitus without complications: Secondary | ICD-10-CM | POA: Diagnosis not present

## 2023-07-21 DIAGNOSIS — M109 Gout, unspecified: Secondary | ICD-10-CM | POA: Diagnosis not present

## 2023-11-20 DIAGNOSIS — R55 Syncope and collapse: Secondary | ICD-10-CM | POA: Diagnosis not present

## 2023-11-20 DIAGNOSIS — S12500A Unspecified displaced fracture of sixth cervical vertebra, initial encounter for closed fracture: Secondary | ICD-10-CM | POA: Diagnosis not present

## 2023-11-20 DIAGNOSIS — S12501A Unspecified nondisplaced fracture of sixth cervical vertebra, initial encounter for closed fracture: Secondary | ICD-10-CM | POA: Diagnosis not present

## 2023-11-20 DIAGNOSIS — S022XXA Fracture of nasal bones, initial encounter for closed fracture: Secondary | ICD-10-CM | POA: Diagnosis not present

## 2023-11-20 DIAGNOSIS — I4510 Unspecified right bundle-branch block: Secondary | ICD-10-CM | POA: Diagnosis not present

## 2023-11-20 DIAGNOSIS — M50322 Other cervical disc degeneration at C5-C6 level: Secondary | ICD-10-CM | POA: Diagnosis not present

## 2023-11-20 DIAGNOSIS — E119 Type 2 diabetes mellitus without complications: Secondary | ICD-10-CM | POA: Diagnosis not present

## 2023-11-20 DIAGNOSIS — R9089 Other abnormal findings on diagnostic imaging of central nervous system: Secondary | ICD-10-CM | POA: Diagnosis not present

## 2023-11-20 DIAGNOSIS — M549 Dorsalgia, unspecified: Secondary | ICD-10-CM | POA: Diagnosis not present

## 2023-11-20 DIAGNOSIS — S299XXA Unspecified injury of thorax, initial encounter: Secondary | ICD-10-CM | POA: Diagnosis not present

## 2023-11-20 DIAGNOSIS — M109 Gout, unspecified: Secondary | ICD-10-CM | POA: Diagnosis not present

## 2023-11-20 DIAGNOSIS — M4312 Spondylolisthesis, cervical region: Secondary | ICD-10-CM | POA: Diagnosis not present

## 2023-11-20 DIAGNOSIS — M799 Soft tissue disorder, unspecified: Secondary | ICD-10-CM | POA: Diagnosis not present

## 2023-11-20 DIAGNOSIS — E785 Hyperlipidemia, unspecified: Secondary | ICD-10-CM | POA: Diagnosis not present

## 2023-11-20 DIAGNOSIS — S0990XA Unspecified injury of head, initial encounter: Secondary | ICD-10-CM | POA: Diagnosis not present

## 2023-11-20 DIAGNOSIS — K219 Gastro-esophageal reflux disease without esophagitis: Secondary | ICD-10-CM | POA: Diagnosis not present

## 2023-11-20 DIAGNOSIS — I10 Essential (primary) hypertension: Secondary | ICD-10-CM | POA: Diagnosis not present

## 2023-11-20 DIAGNOSIS — I082 Rheumatic disorders of both aortic and tricuspid valves: Secondary | ICD-10-CM | POA: Diagnosis not present

## 2023-11-20 DIAGNOSIS — M47812 Spondylosis without myelopathy or radiculopathy, cervical region: Secondary | ICD-10-CM | POA: Diagnosis not present

## 2023-11-20 DIAGNOSIS — J323 Chronic sphenoidal sinusitis: Secondary | ICD-10-CM | POA: Diagnosis not present

## 2023-11-20 DIAGNOSIS — S3992XA Unspecified injury of lower back, initial encounter: Secondary | ICD-10-CM | POA: Diagnosis not present

## 2023-11-21 DIAGNOSIS — I071 Rheumatic tricuspid insufficiency: Secondary | ICD-10-CM | POA: Diagnosis not present

## 2023-11-21 DIAGNOSIS — R918 Other nonspecific abnormal finding of lung field: Secondary | ICD-10-CM | POA: Diagnosis not present

## 2023-11-21 DIAGNOSIS — S29009A Unspecified injury of muscle and tendon of unspecified wall of thorax, initial encounter: Secondary | ICD-10-CM | POA: Diagnosis not present

## 2023-11-21 DIAGNOSIS — I10 Essential (primary) hypertension: Secondary | ICD-10-CM | POA: Diagnosis not present

## 2023-11-21 DIAGNOSIS — R55 Syncope and collapse: Secondary | ICD-10-CM | POA: Diagnosis not present

## 2023-11-21 DIAGNOSIS — I340 Nonrheumatic mitral (valve) insufficiency: Secondary | ICD-10-CM | POA: Diagnosis not present

## 2023-11-21 DIAGNOSIS — I272 Pulmonary hypertension, unspecified: Secondary | ICD-10-CM | POA: Diagnosis not present

## 2023-12-07 DIAGNOSIS — D2372 Other benign neoplasm of skin of left lower limb, including hip: Secondary | ICD-10-CM | POA: Diagnosis not present

## 2023-12-07 DIAGNOSIS — M79672 Pain in left foot: Secondary | ICD-10-CM | POA: Diagnosis not present

## 2023-12-09 DIAGNOSIS — R55 Syncope and collapse: Secondary | ICD-10-CM | POA: Diagnosis not present

## 2023-12-20 DIAGNOSIS — R55 Syncope and collapse: Secondary | ICD-10-CM | POA: Diagnosis not present

## 2023-12-21 DIAGNOSIS — S12501A Unspecified nondisplaced fracture of sixth cervical vertebra, initial encounter for closed fracture: Secondary | ICD-10-CM | POA: Diagnosis not present

## 2023-12-24 DIAGNOSIS — H40013 Open angle with borderline findings, low risk, bilateral: Secondary | ICD-10-CM | POA: Diagnosis not present

## 2023-12-24 DIAGNOSIS — H26493 Other secondary cataract, bilateral: Secondary | ICD-10-CM | POA: Diagnosis not present

## 2023-12-24 DIAGNOSIS — Z961 Presence of intraocular lens: Secondary | ICD-10-CM | POA: Diagnosis not present

## 2024-01-03 DIAGNOSIS — D2372 Other benign neoplasm of skin of left lower limb, including hip: Secondary | ICD-10-CM | POA: Diagnosis not present

## 2024-01-03 DIAGNOSIS — M79672 Pain in left foot: Secondary | ICD-10-CM | POA: Diagnosis not present

## 2024-01-03 DIAGNOSIS — M542 Cervicalgia: Secondary | ICD-10-CM | POA: Diagnosis not present

## 2024-01-12 DIAGNOSIS — M542 Cervicalgia: Secondary | ICD-10-CM | POA: Diagnosis not present

## 2024-01-14 DIAGNOSIS — M542 Cervicalgia: Secondary | ICD-10-CM | POA: Diagnosis not present

## 2024-01-19 DIAGNOSIS — M542 Cervicalgia: Secondary | ICD-10-CM | POA: Diagnosis not present

## 2024-01-26 DIAGNOSIS — R7303 Prediabetes: Secondary | ICD-10-CM | POA: Diagnosis not present

## 2024-01-26 DIAGNOSIS — M109 Gout, unspecified: Secondary | ICD-10-CM | POA: Diagnosis not present

## 2024-01-26 DIAGNOSIS — D472 Monoclonal gammopathy: Secondary | ICD-10-CM | POA: Diagnosis not present

## 2024-01-26 DIAGNOSIS — E785 Hyperlipidemia, unspecified: Secondary | ICD-10-CM | POA: Diagnosis not present

## 2024-01-26 DIAGNOSIS — D61818 Other pancytopenia: Secondary | ICD-10-CM | POA: Diagnosis not present

## 2024-01-26 DIAGNOSIS — E538 Deficiency of other specified B group vitamins: Secondary | ICD-10-CM | POA: Diagnosis not present

## 2024-02-02 DIAGNOSIS — I10 Essential (primary) hypertension: Secondary | ICD-10-CM | POA: Diagnosis not present

## 2024-02-02 DIAGNOSIS — Z1331 Encounter for screening for depression: Secondary | ICD-10-CM | POA: Diagnosis not present

## 2024-02-02 DIAGNOSIS — D472 Monoclonal gammopathy: Secondary | ICD-10-CM | POA: Diagnosis not present

## 2024-02-02 DIAGNOSIS — Z0000 Encounter for general adult medical examination without abnormal findings: Secondary | ICD-10-CM | POA: Diagnosis not present

## 2024-02-02 DIAGNOSIS — D692 Other nonthrombocytopenic purpura: Secondary | ICD-10-CM | POA: Diagnosis not present

## 2024-02-02 DIAGNOSIS — E119 Type 2 diabetes mellitus without complications: Secondary | ICD-10-CM | POA: Diagnosis not present

## 2024-02-02 DIAGNOSIS — M1A9XX0 Chronic gout, unspecified, without tophus (tophi): Secondary | ICD-10-CM | POA: Diagnosis not present

## 2024-02-02 DIAGNOSIS — D61818 Other pancytopenia: Secondary | ICD-10-CM | POA: Diagnosis not present

## 2024-02-02 DIAGNOSIS — E538 Deficiency of other specified B group vitamins: Secondary | ICD-10-CM | POA: Diagnosis not present
